# Patient Record
Sex: Male | Born: 1985
Health system: Southern US, Community
[De-identification: ages and names within clinical notes are randomized; demographics above are authoritative.]

## PROBLEM LIST (undated history)

## (undated) DIAGNOSIS — E349 Endocrine disorder, unspecified: Secondary | ICD-10-CM

## (undated) DIAGNOSIS — I4891 Unspecified atrial fibrillation: Secondary | ICD-10-CM

## (undated) DIAGNOSIS — I480 Paroxysmal atrial fibrillation: Secondary | ICD-10-CM

## (undated) DIAGNOSIS — R011 Cardiac murmur, unspecified: Secondary | ICD-10-CM

## (undated) DIAGNOSIS — I341 Nonrheumatic mitral (valve) prolapse: Secondary | ICD-10-CM

## (undated) HISTORY — DX: Paroxysmal atrial fibrillation: I48.0

## (undated) HISTORY — DX: Endocrine disorder, unspecified: E34.9

## (undated) HISTORY — DX: Nonrheumatic mitral (valve) prolapse: I34.1

## (undated) HISTORY — DX: Cardiac murmur, unspecified: R01.1

## (undated) HISTORY — PX: HIP SURGERY: SHX245

## (undated) HISTORY — DX: Unspecified atrial fibrillation: I48.91

---
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
## 2004-04-13 ENCOUNTER — Encounter: Admission: RE | Admit: 2004-04-13 | Discharge: 2004-04-13 | Payer: Self-pay | Admitting: Sports Medicine

## 2004-05-24 HISTORY — PX: WRIST SURGERY: SHX841

## 2004-12-11 HISTORY — PX: NM MYOCAR PERF WALL MOTION: HXRAD629

## 2005-01-21 ENCOUNTER — Inpatient Hospital Stay (HOSPITAL_COMMUNITY): Admission: EM | Admit: 2005-01-21 | Discharge: 2005-01-22 | Payer: Self-pay | Admitting: Emergency Medicine

## 2005-08-23 ENCOUNTER — Ambulatory Visit: Payer: Self-pay | Admitting: Internal Medicine

## 2005-08-26 ENCOUNTER — Ambulatory Visit (HOSPITAL_COMMUNITY): Admission: RE | Admit: 2005-08-26 | Discharge: 2005-08-26 | Payer: Self-pay | Admitting: Cardiovascular Disease

## 2010-09-20 ENCOUNTER — Inpatient Hospital Stay (HOSPITAL_COMMUNITY)
Admission: EM | Admit: 2010-09-20 | Discharge: 2010-09-21 | DRG: 310 | Disposition: A | Discharge status: Home / Self Care | Payer: 59 | Attending: Family Medicine | Admitting: Family Medicine

## 2010-09-20 DIAGNOSIS — I059 Rheumatic mitral valve disease, unspecified: Secondary | ICD-10-CM

## 2010-09-20 DIAGNOSIS — I4891 Unspecified atrial fibrillation: Secondary | ICD-10-CM

## 2010-09-20 DIAGNOSIS — F17200 Nicotine dependence, unspecified, uncomplicated: Secondary | ICD-10-CM | POA: Diagnosis present

## 2010-09-20 DIAGNOSIS — E291 Testicular hypofunction: Secondary | ICD-10-CM | POA: Diagnosis present

## 2010-09-20 DIAGNOSIS — L708 Other acne: Secondary | ICD-10-CM | POA: Diagnosis present

## 2010-09-20 LAB — CARDIAC PANEL(CRET KIN+CKTOT+MB+TROPI)
CK, MB: 1.1 ng/mL (ref 0.3–4.0)
CK, MB: 1 ng/mL (ref 0.3–4.0)
Relative Index: 0.6 (ref 0.0–2.5)
Relative Index: 0.6 (ref 0.0–2.5)
Total CK: 171 U/L (ref 7–232)
Total CK: 169 U/L (ref 7–232)
Troponin I: 0.01 ng/mL (ref 0.00–0.06)
Troponin I: 0.01 ng/mL (ref 0.00–0.06)

## 2010-09-20 LAB — MAGNESIUM: Magnesium: 2 mg/dL (ref 1.5–2.5)

## 2010-09-20 LAB — POCT I-STAT, CHEM 8
BUN: 19 mg/dL (ref 6–23)
Calcium, Ion: 1.02 mmol/L — ABNORMAL LOW (ref 1.12–1.32)
Chloride: 109 mEq/L (ref 96–112)
Creatinine, Ser: 1.4 mg/dL (ref 0.4–1.5)
Glucose, Bld: 105 mg/dL — ABNORMAL HIGH (ref 70–99)
HCT: 49 % (ref 39.0–52.0)
Hemoglobin: 16.7 g/dL (ref 13.0–17.0)
Potassium: 5.1 mEq/L (ref 3.5–5.1)
Sodium: 141 mEq/L (ref 135–145)
TCO2: 25 mmol/L (ref 0–100)

## 2010-09-20 LAB — DIFFERENTIAL
Basophils Absolute: 0.1 10*3/uL (ref 0.0–0.1)
Basophils Relative: 1 % (ref 0–1)
Eosinophils Absolute: 0 10*3/uL (ref 0.0–0.7)
Eosinophils Relative: 0 % (ref 0–5)
Lymphocytes Relative: 25 % (ref 12–46)
Lymphs Abs: 2.8 10*3/uL (ref 0.7–4.0)
Monocytes Absolute: 0.8 10*3/uL (ref 0.1–1.0)
Monocytes Relative: 7 % (ref 3–12)
Neutro Abs: 7.9 10*3/uL — ABNORMAL HIGH (ref 1.7–7.7)
Neutrophils Relative %: 68 % (ref 43–77)

## 2010-09-20 LAB — CBC
HCT: 45.8 % (ref 39.0–52.0)
Hemoglobin: 16.1 g/dL (ref 13.0–17.0)
MCH: 30.9 pg (ref 26.0–34.0)
MCHC: 35.2 g/dL (ref 30.0–36.0)
MCV: 87.9 fL (ref 78.0–100.0)
Platelets: 329 10*3/uL (ref 150–400)
RBC: 5.21 MIL/uL (ref 4.22–5.81)
RDW: 13.6 % (ref 11.5–15.5)
WBC: 11.6 10*3/uL — ABNORMAL HIGH (ref 4.0–10.5)

## 2010-09-20 LAB — PHOSPHORUS: Phosphorus: 3.4 mg/dL (ref 2.3–4.6)

## 2010-09-20 LAB — ETHANOL: Alcohol, Ethyl (B): <5 mg/dL (ref 0–10)

## 2010-09-20 LAB — TSH: TSH: 1.644 u[IU]/mL (ref 0.350–4.500)

## 2010-09-22 LAB — DRUGS OF ABUSE SCREEN W/O ALC, ROUTINE URINE
Amphetamine Screen, Ur: NEGATIVE
Barbiturate Quant, Ur: NEGATIVE
Benzodiazepines.: NEGATIVE
Cocaine Metabolites: NEGATIVE
Creatinine,U: 100.6 mg/dL
Marijuana Metabolite: NEGATIVE
Methadone: NEGATIVE
Opiate Screen, Urine: NEGATIVE
Phencyclidine (PCP): NEGATIVE
Propoxyphene: NEGATIVE

## 2010-10-06 HISTORY — PX: US ECHOCARDIOGRAPHY: HXRAD669

## 2010-10-07 NOTE — Discharge Summary (Signed)
NAMEMOHAN, ERVEN NO.:  [id number]  MEDICAL RECORD NO.:  [id number]           PATIENT TYPE:  E  LOCATION:  [facility]                         FACILITY:  [facility]  PHYSICIAN:  Nestor Ramp, MD        DATE OF BIRTH:  05-Apr-1986  DATE OF ADMISSION:  09/20/2010 DATE OF DISCHARGE:  09/21/2010                              DISCHARGE SUMMARY   PRIMARY CARE PHYSICIAN:  The patient was unassigned, he attends [facility] for acute visit.  PRIMARY CARDIOLOGIST:  The patient will follow up with cardiologist at [facility] and Vascular.  DISCHARGE DIAGNOSES: 1. Atrial fibrillation with rapid ventricular response, now rate     controlled. 2. Hypogonadism secondary to testicular torsion. 3. Acne.  DISCHARGE MEDICATIONS:  New medications at discharge: 1. Aspirin 81 mg 1 tablet p.o. daily. 2. Cardizem CD 120 mg 1 tablet p.o. at bedtime. 3. Testosterone gel 1 application topically daily. PERTINENT LABS AT DISCHARGE:  The patient's urine drug screen is negative.  Cardiac enzymes were negative x2.  TSH 1.644, phos 3.1, magnesium 2, alcohol level less than 5.  PERTINENT STUDIES AT DISCHARGE:  Repeat EKG obtained on September 21, 2010, with normal sinus rhythm with the heart rate of 68.  BRIEF HOSPITAL COURSE:  [name] is a 25 year old male who presented with the known history of AFib with RVR, first occurring in 2006, and again in 2009 who presented with acute onset of AFib with RVR on the morning of September 20, 2010, following a night out where he consumed excessive alcohol. 1. AFib with RVR.  The patient with rate control with Cardizem,     initially IV Cardizem, he required 20 mg bolus x2, and then 10 mg     per hour for rate control, who has transitioned overnight to p.o.     Cardizem 30 mg p.o. t.Marsh.d. and was rate controlled on that dose.     His lab studies were as mentioned above.  Cardiology consult was     obtained, and the patient was evaluated by  [facility] and     Vascular.  We have seen his rest AFib with RVR in 2006.  He was     stable and rate controlled on the Cardizem.  He should follow up     with repeat echo as an outpatient.  The patient's immediate release     Cardizem was changed to Cardizem CR 120     mg PO at bedtime.  The patient was also started on     aspirin as primary stroke prevention.  DISCHARGE PHYSICAL EXAM:  On day of discharge, [name] is in no acute distress.  He denies dizziness or feeling of syncope or presyncope when he stands.  He denies palpitations.  He is normal sinus rhythm at discharge with no murmurs, rubs, or gallops appreciated on cardiac exam.  DISCHARGE INSTRUCTIONS:  The patient was discharged to home with no limitations on activity or diet.  He was instructed to followup at [facility] and Vascular  or repeat echo scheduled on Oct 08, 2010.  He was instructed that office will call him  with the details.  DISCHARGE CONDITION:  The patient was discharged to home in stable medical condition.______________________________ Dessa Phi, MD   ______________________________ Nestor Ramp, MD    JF/MEDQ  D:  09/22/2010  T:  09/23/2010  Job:  [id number]  Electronically Signed by Dessa Phi MD on 10/02/2010 03:19:50 PM Electronically Signed by Denny Levy MD on 10/07/2010 04:33:11 PM

## 2010-10-22 NOTE — H&P (Signed)
[name], [name]           ACCOUNT NO.:  [id number]  MEDICAL RECORD NO.:  [id number]           PATIENT TYPE:  E  LOCATION:  [facility]                         FACILITY:  [facility]  PHYSICIAN:  Pearlean Brownie, M.D.DATE OF BIRTH:  11/23/1985  DATE OF ADMISSION:  09/20/2010 DATE OF DISCHARGE:                             HISTORY & PHYSICAL   PRIMARY CARE PHYSICIAN:  The patient attends [facility] with acute issues, last seen at February of this year.  CHIEF COMPLAINT:  Palpitations and dizziness.  HISTORY OF PRESENT ILLNESS:  The patient is a 25 year old male with a known history of paroxysmal AFib with RVR x2 episodes, presents with sudden onset of palpitations associated with feeling lightheaded/dizzy this morning that started around 7:15 a.m.  The patient was out celebrating at a bachelor party last evening where he consumes 14-15 beers in a 4-hour period.  He reports vomiting x1 and smoking cigars. He says that excess of alcohol intake is unusual for him.  He has a history of AFib, is unusual for him.  He went home, went to sleep, and woke up this morning with as mentioned above palpitations and feeling lightheaded.  He denies chest pain, shortness of breath, recurrent nausea, vomiting, syncope.  The patient first had episode of AFib with RVR in 2006 when he was hospitalized at [facility] overnight and treated with Cardizem.  He was evaluated by Cardiology and Electrophysiology.  Echo obtained around that admission, it did revealed mitral valve prolapse.  The patient also had an MRI of the heart that was within normal limits.  Negative for scarring or anomalous coronary arteries.  Prior to that admission, the patient wore Holter monitor for 3 months and was cleared from medical standpoint, return to football, and found to be medically stable with no repeat events.  Next, in 2009, the patient had a repeat of palpitations, went to [facility] ED, was  found to have AFib with RVR that spontaneously converted to normal sinus rhythm within 4 hours.  He was not admitted at that time. Since then, the patient has not seen a cardiologist.  He has not been on medications for rate control as an outpatient.  ED COURSE:  At this admission, the patient came in with a heart rate of 180.  He was given Cardizem 10 mg IV bolus x1 and then placed on 5 mg per hour.  His heart rate came down to 148.  He was re-bolused with 20 mg of IV Cardizem and his heart rate at that time was 134 and it came down to 124.  He is now maintained on Cardizem 10 mg per hour.  Heart rate when he got to the floor was 150, but it is now 105.  The patient is a full code.  ALLERGIES:  No known drug allergies.  MEDICATIONS:  He takes Testim for hypoandrogenism and doxycycline for acne.  PAST MEDICAL HISTORY:  AFib with RVR, first in 2006 and then in 2009. Mitral valve prolapse find as in echo in 2006.  Testicular torsion, 2006.  PAST SURGICAL HISTORY:  Testicular torsion repair in 2006, wrist surgery from accident sustained  during football in 2005.  SOCIAL HISTORY:  The patient lives alone with his dog.  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  He smokes cigars socially, drinks one to two beers per week.  No illicit drug use now or in the past.  FAMILY HISTORY:  Significant for prediabetes in his father.  His twin brothers are healthy.  REVIEW OF SYSTEMS:  Negative except for cardiovascular palpitation. Neuro, dizziness.  The rest of 12-system review of systems is negative.  PHYSICAL EXAM:  VITAL SIGNS:  Temperature 98.6, pulse 180 down to 108, respiratory rate 20, blood pressure initially 105/75 and then 125/96, most recent 105/73, O2 sats 97% on room air. GENERAL:  The patient is awake, alert, well-appearing adult male, in no acute distress. HEENT:  Normocephalic, atraumatic.  Moist mucous membranes. NECK:  Supple.  No JVD.  No carotid  bruits. CARDIOVASCULAR:  Irregularly irregular heart rate.  No murmurs, rubs or gallops appreciated. LUNGS:  Normal work of breathing.  Clear to auscultation bilaterally. No wheezes, rhonchi, or crackles.  Breath sounds are full and equal bilaterally. ABDOMEN:  Normoactive bowel sounds, soft, nontender, nondistended.  No masses. BACK:  Deferred. GU:  Deferred. RECTAL:  Deferred. EXTREMITIES:  No edema.  No calf tenderness. NEURO:  The patient is alert and oriented x3; 5/5 muscle strength in bilateral upper and lower extremities.  Sensation is full and equal bilaterally.  Normal gait.  LABS AND STUDIES:  EKG obtained in the ED, AFib with RVR, T-wave inversions in lateral leads V4-V6.  CBC:  White count 11.6, hemoglobin 16.1, hematocrit is 48.5, platelets 329.  I-STAT all within normal limits.  Sodium 141, potassium 5.1, chloride 109, bicarb 25, BUN 19, creatinine 1.4, glucose 105.  ASSESSMENT AND PLAN:  For this 25 year old male, who presents with palpitations and dizziness, found to have recurrent atrial fibrillation with rapid ventricular response. 1. Atrial fibrillation with rapid ventricular response.  The patient     has a known history of paroxysmal atrial fibrillation with rapid     ventricular response.  Had an extensive workup in 2006.  Likely     triggered at this time by excessive EtOH intake.  The patient has     stable cardiorespiratory status.  Plan to admit the patient to     tele, continue IV Cardizem and change this is to p.o. once the     patient is adequately rate controlled.  Goal heart rate less than     110.  Plan to start aspirin 81 mg p.o. daily.  The patient's CHADS     score is zero.  Cardiology consult with [facility] and     Vascular obtained.  We will follow up heart recommendations.     Appreciate Cardiology input.  We will obtain cardiac enzymes x2 to     rule out acute coronary syndrome, although, very unlikely given the     patient denies  chest pain or pressure, and also has no risk     factors.  We will also obtain mag and phos levels for completion. 2. History of mitral valve prolapse.  No murmurs appreciated on     auscultation, but may be obscured by elevated heart rate.  Plan to     obtain 2D echo to evaluate. 3. FEN/GI:  Lytes within normal limits.  Regular diet.  KVO IV fluids.     Follow up mag and phos. 4. Deep vein thrombosis prophylaxis, Lovenox 40 mg subcu daily.  5. Disposition.  Pending.  The patient's rate controlled on Cardizem     and transitioned to oral Cardizem and/or he converts his normal     sinus rhythm.  We will follow up Cardiology recommendations.     Anticipate short hospital course 1-2 days.    ______________________________ Dessa Phi, MD   ______________________________ Pearlean Brownie, M.D.    JF/MEDQ  D:  09/20/2010  T:  09/21/2010  Job:  [id number]  Electronically Signed by Dessa Phi MD on 10/02/2010 03:20:05 PM Electronically Signed by Pearlean Brownie M.D. on 10/22/2010 09:41:52 AM

## 2010-12-02 ENCOUNTER — Ambulatory Visit
Admission: RE | Admit: 2010-12-02 | Discharge: 2010-12-02 | Disposition: A | Discharge status: Home / Self Care | Payer: 59 | Source: Ambulatory Visit | Attending: Family Medicine | Admitting: Family Medicine

## 2010-12-02 ENCOUNTER — Other Ambulatory Visit: Payer: Self-pay | Admitting: Family Medicine

## 2010-12-02 DIAGNOSIS — S0990XA Unspecified injury of head, initial encounter: Secondary | ICD-10-CM

## 2011-10-03 ENCOUNTER — Ambulatory Visit (INDEPENDENT_AMBULATORY_CARE_PROVIDER_SITE_OTHER): Payer: 59 | Admitting: Family Medicine

## 2011-10-03 VITALS — BP 132/71 | HR 49 | Temp 98.7°F | Resp 16 | Ht 72.5 in | Wt 195.6 lb

## 2011-10-03 DIAGNOSIS — L723 Sebaceous cyst: Secondary | ICD-10-CM

## 2011-10-03 NOTE — Progress Notes (Signed)
Procedure:  Lidocaine 2% plain, injected around cyst.  4 cc Area outlined Sterile field and prep Elliptical excision made with # 15 scalpel. Medium sized sebaceous cyst excised, intact cyst wall. # 3 SQ/4.0 Vicryl #3 HM/# 1 SI 4.0 Ethilon Cleansed. Dressed. Patient tolerated procedure well.

## 2011-10-03 NOTE — Progress Notes (Signed)
26 yo man with chronic swollen subcutaneous nodule for years.  He is concerned about cancer.  O: 1 cm posterior cervical spine subQ nodule at C7, freely mobile, nontender.  A: sebaceous cyst  P:

## 2011-10-03 NOTE — Patient Instructions (Addendum)
Epidermal Cyst An epidermal cyst is sometimes called a sebaceous cyst, epidermal inclusion cyst, or infundibular cyst. These cysts usually contain a substance that looks "pasty" or "cheesy" and may have a bad smell. This substance is a protein called keratin. Epidermal cysts are usually found on the face, neck, or trunk. They may also occur in the vaginal area or other parts of the genitalia of both men and women. Epidermal cysts are usually small, painless, slow-growing bumps or lumps that move freely under the skin. It is important not to try to pop them. This may cause an infection and lead to tenderness and swelling. CAUSES  Epidermal cysts may be caused by a deep penetrating injury to the skin or a plugged hair follicle, often associated with acne. SYMPTOMS  Epidermal cysts can become inflamed and cause:  Redness.   Tenderness.   Increased temperature of the skin over the bumps or lumps.   Grayish-white, bad smelling material that drains from the bump or lump.  DIAGNOSIS  Epidermal cysts are easily diagnosed by your caregiver during an exam. Rarely, a tissue sample (biopsy) may be taken to rule out other conditions that may resemble epidermal cysts. TREATMENT   Epidermal cysts often get better and disappear on their own. They are rarely ever cancerous.   If a cyst becomes infected, it may become inflamed and tender. This may require opening and draining the cyst. Treatment with antibiotics may be necessary. When the infection is gone, the cyst may be removed with minor surgery.   Small, inflamed cysts can often be treated with antibiotics or by injecting steroid medicines.   Sometimes, epidermal cysts become large and bothersome. If this happens, surgical removal in your caregiver's office may be necessary.  HOME CARE INSTRUCTIONS  Only take over-the-counter or prescription medicines as directed by your caregiver.   Take your antibiotics as directed. Finish them even if you start to  feel better.  SEEK MEDICAL CARE IF:   Your cyst becomes tender, red, or swollen.   Your condition is not improving or is getting worse.   You have any other questions or concerns.  MAKE SURE YOU:  Understand these instructions.   Will watch your condition.   Will get help right away if you are not doing well or get worse.  Document Released: 04/10/2004 Document Revised: 04/29/2011 Document Reviewed: 11/16/2010 [facility] Patient Information 2012 [employer], [place].   WOUND CARE Please return in 10 days to have your stitches/staples removed or sooner if you have concerns. Marland Kitchen Keep area clean and dry for 24 hours. Do not remove bandage, if applied. . After 24 hours, remove bandage and wash wound gently with mild soap and warm water. Reapply a new bandage after cleaning wound, if directed. . Continue daily cleansing with soap and water until stitches/staples are removed. . Do not apply any ointments or creams to the wound while stitches/staples are in place, as this may cause delayed healing. . Notify the office if you experience any of the following signs of infection: Swelling, redness, pus drainage, streaking, fever >101.0 F . Notify the office if you experience excessive bleeding that does not stop after 15-20 minutes of constant, firm pressure.

## 2011-10-13 ENCOUNTER — Encounter: Payer: Self-pay | Admitting: Physician Assistant

## 2011-10-13 ENCOUNTER — Ambulatory Visit (INDEPENDENT_AMBULATORY_CARE_PROVIDER_SITE_OTHER): Payer: 59 | Admitting: Physician Assistant

## 2011-10-13 VITALS — BP 108/68 | HR 82 | Temp 98.0°F | Resp 16 | Ht 72.5 in | Wt 193.2 lb

## 2011-10-13 DIAGNOSIS — L723 Sebaceous cyst: Secondary | ICD-10-CM

## 2011-10-13 NOTE — Progress Notes (Signed)
[name] comes in today for suture removal s/p sebaceous cyst excision on 10/03/11.  He has done very well with minimal pain.    Posterior neck with # 4 sutures intact and removed.  Wound edges well approximated with small amount of central incision dehiscence.  Steri strips applied. No induration, redness, drainage or tenderness.   Recommend RTC prn

## 2011-10-22 ENCOUNTER — Ambulatory Visit (INDEPENDENT_AMBULATORY_CARE_PROVIDER_SITE_OTHER): Payer: 59 | Admitting: Family Medicine

## 2011-10-22 ENCOUNTER — Encounter: Payer: Self-pay | Admitting: Family Medicine

## 2011-10-22 VITALS — BP 119/74 | HR 64 | Temp 98.4°F | Resp 18 | Ht 72.5 in | Wt 197.0 lb

## 2011-10-22 DIAGNOSIS — E236 Other disorders of pituitary gland: Secondary | ICD-10-CM

## 2011-10-22 DIAGNOSIS — L709 Acne, unspecified: Secondary | ICD-10-CM

## 2011-10-22 DIAGNOSIS — L708 Other acne: Secondary | ICD-10-CM

## 2011-10-22 DIAGNOSIS — E291 Testicular hypofunction: Secondary | ICD-10-CM

## 2011-10-22 LAB — TESTOSTERONE: Testosterone: 224.98 ng/dL — ABNORMAL LOW (ref 300–890)

## 2011-10-22 MED ORDER — DOXYCYCLINE HYCLATE 100 MG PO CPEP: 100.0000 mg | ORAL_CAPSULE | Freq: Every day | ORAL | Status: DC

## 2011-10-22 MED ORDER — TESTOSTERONE 50 MG/5GM (1%) TD GEL: 5.0000 g | Freq: Every day | TRANSDERMAL | Status: DC

## 2011-10-22 NOTE — Patient Instructions (Addendum)
stick with gentle cleansers (like Cetaphil Liquid Cleanser), and can use over the counter benzoyl peroxide if mild acne.  If needed can also use doxycycline one per day as in past.  Your should receive a call or letter about your lab results within the next week to 10 days.  Recheck in 3 months.

## 2011-10-22 NOTE — Progress Notes (Signed)
  Subjective:    Patient ID: [name], male    DOB: 08-11-1985, 26 y.o.   MRN: [id number]  HPI [name] is a 26 y.o. male  Hypogonadism, low testosterone -  OV 12/22/10 - prior on 2 tubes testim qd, but when levels checked then - had been running out of meds - off for 2 weeks at that point.  testosterone 211, psa 0.79.  Plan to recheck levels in 3 months. Had been off meds as unable to return for OV.  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 No noticeable side effects when on meds.   Did [employer] workout.  Lost 20 pounds.    Acne - on doxy 100mg  QD prior - noticed breakouts with testosterone. Not taking now.  No current acne.   Sebaceous cyst excision - 10/03/11.   Review of Systems     Objective:   Physical Exam  Constitutional: He is oriented to person, place, and time. He appears well-developed and well-nourished.  HENT:  Head: Normocephalic and atraumatic.  Pulmonary/Chest: Effort normal.  Genitourinary: Testes normal. Right testis shows no mass. Left testis shows no mass.  Neurological: He is alert and oriented to person, place, and time.  Skin: Skin is warm and dry. No rash noted. No erythema.       No current comedones visualized.  Psychiatric: He has a normal mood and affect. His behavior is normal.       Assessment & Plan:  [name] is a 26 y.o. male   Acne -   stick with gentle cleansers (like Cetaphil Liquid Cleanser), and can use over the counter benzoyl peroxide if mild acne.  If needed can also use doxycycline one per day as in past.   Hypogonadism - restart testosterone -  1 packet each day, and recheck levels in 3 months.  Check PSA.

## 2011-10-23 LAB — PSA: PSA: 0.72 ng/mL (ref <=4.00)

## 2011-12-22 NOTE — Progress Notes (Signed)
Completed prior auth form for pt's Androgel and faxed into [facility] w/confirmation.

## 2011-12-23 ENCOUNTER — Telehealth: Payer: Self-pay

## 2011-12-23 NOTE — Telephone Encounter (Signed)
Spoke w/pt to ask whether he had any problems w/ the Testim he was previously on for low T because his insurance prefers this medication and will not cover Androgel unless he tried/failed the Testim. Pt reports that the Testim seemed to work fine for him and he is fine w/having [place] send this Rx into his pharmacy. Dr Neva Seat, can you send in the Rx for Testim that you want pt on? Pt already knows that we have discussed this and you will be doing so.

## 2011-12-26 ENCOUNTER — Ambulatory Visit (INDEPENDENT_AMBULATORY_CARE_PROVIDER_SITE_OTHER): Payer: 59 | Admitting: Family Medicine

## 2011-12-26 VITALS — BP 130/80 | HR 104 | Temp 99.2°F | Resp 16 | Ht 72.5 in | Wt 189.2 lb

## 2011-12-26 DIAGNOSIS — M79609 Pain in unspecified limb: Secondary | ICD-10-CM

## 2011-12-26 DIAGNOSIS — M79604 Pain in right leg: Secondary | ICD-10-CM

## 2011-12-26 DIAGNOSIS — M76899 Other specified enthesopathies of unspecified lower limb, excluding foot: Secondary | ICD-10-CM

## 2011-12-26 DIAGNOSIS — M7060 Trochanteric bursitis, unspecified hip: Secondary | ICD-10-CM

## 2011-12-26 MED ORDER — DICLOFENAC SODIUM 75 MG PO TBEC: 75.0000 mg | DELAYED_RELEASE_TABLET | Freq: Two times a day (BID) | ORAL | Status: DC

## 2011-12-26 MED ORDER — METHOCARBAMOL 750 MG PO TABS: ORAL_TABLET | ORAL | Status: DC

## 2011-12-26 NOTE — Patient Instructions (Addendum)
Ice  Medications as ordered  Return if worse or not better

## 2011-12-26 NOTE — Progress Notes (Signed)
Subjective: 26 year old male who 2 weeks ago was running the bases and got intense pain in both lateral thighs the left side seems to have subsided, but the right thigh continues to hurt him. He is playing ball again today and it hurt badly again. He has taken some OTC ibuprofen as well as using ice and heat on this. He works out regularly at [employer]. He is healthy otherwise. 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  Objective: tenderness at the right greater trochanteric knob.  Actually the intensity of the pain has subsided some now from earlier. Otherwise leg motion seems fine.  Assessment: Right leg muscle strain and inflammation of  greater trochanteric bursa  Plan: Anti-inflammatory medication Ice  If he continues to have further problems we will probably want to refer him for his physical therapy. He is going to do stretching.

## 2011-12-27 ENCOUNTER — Other Ambulatory Visit: Payer: Self-pay | Admitting: Family Medicine

## 2011-12-27 MED ORDER — TESTOSTERONE 50 MG/5GM (1%) TD GEL: 5.0000 g | Freq: Every day | TRANSDERMAL | Status: DC

## 2011-12-27 NOTE — Telephone Encounter (Signed)
Faxed Rx to [facility] and notified pt

## 2011-12-27 NOTE — Telephone Encounter (Signed)
i have prescribed it but it will print out - i can sign when i arrive today.

## 2011-12-29 ENCOUNTER — Ambulatory Visit (INDEPENDENT_AMBULATORY_CARE_PROVIDER_SITE_OTHER): Payer: 59 | Admitting: Family Medicine

## 2011-12-29 ENCOUNTER — Ambulatory Visit: Payer: 59

## 2011-12-29 VITALS — BP 110/62 | HR 56 | Temp 98.4°F | Resp 12 | Ht 72.5 in | Wt 197.8 lb

## 2011-12-29 DIAGNOSIS — M79659 Pain in unspecified thigh: Secondary | ICD-10-CM

## 2011-12-29 DIAGNOSIS — IMO0002 Reserved for concepts with insufficient information to code with codable children: Secondary | ICD-10-CM

## 2011-12-29 DIAGNOSIS — M79609 Pain in unspecified limb: Secondary | ICD-10-CM

## 2011-12-29 DIAGNOSIS — S76119A Strain of unspecified quadriceps muscle, fascia and tendon, initial encounter: Secondary | ICD-10-CM

## 2011-12-29 LAB — CK: Total CK: 174 U/L (ref 7–232)

## 2011-12-29 NOTE — Progress Notes (Signed)
  Subjective:    Patient ID: [name], male    DOB: June 20, 1985, 26 y.o.   MRN: [id number]  HPI [name] is a 26 y.o. male Seen 12/26/11 for upper leg pain, diagnosed with R muscle strain and trochanteric bursitis.  rx robaxin (taking at night), and voltaren 75mg  BID - taking twice per day - past 3 days.  Original injury - July 23rd - running down 1st base line - felt pains in front of both quads.    Able to get to 2nd base, but was limping.  Treated with ice and heat at that time.  L leg improved in 1st week, no problems in L leg now, but R leg has not improved. Still with pain in front of leg - feels better for few days - then if jogging up hill, or up and down steps, or getting out of car can flare it up.  Not as bad as initially, but not resolving.  No change since last visit.   No fever, no dark urine - now, or after initial sx's by memory. Slight weakness, more soreness.    Similar sx's when playing football in high school - treated with ace wraps on own.   Review of Systems As above.      Objective:   Physical Exam  Constitutional: He is oriented to person, place, and time. He appears well-developed and well-nourished.  HENT:  Head: Normocephalic and atraumatic.  Pulmonary/Chest: Effort normal.  Musculoskeletal: Normal range of motion. He exhibits no edema.       Right knee: Normal.       Legs:      Calf nt.   Lymphadenopathy:       Right: No inguinal adenopathy present.  Neurological: He is alert and oriented to person, place, and time.  Skin: Skin is warm and dry. No rash noted.  Psychiatric: He has a normal mood and affect. His behavior is normal.    [facility] reading (PRIMARY) by  Dr. Neva Seat: R femur: NAD.      Assessment & Plan:  [name] is a 26 y.o. male 1. Quadriceps strain  CK  2. Thigh pain  DG Femur Right, CK   Suspected strain with recurrent reinjury/flairs. No focal deficit.  Trial of pt, HEp with stretches prn, ace wrap.  Ok to use  robaxin and voltaren, but only as needed.  If not improving with PT - consider ortho eval or MRI.  rtc sooner if worse.

## 2012-01-20 ENCOUNTER — Ambulatory Visit (INDEPENDENT_AMBULATORY_CARE_PROVIDER_SITE_OTHER): Payer: 59 | Admitting: Family Medicine

## 2012-01-20 VITALS — BP 104/68 | HR 64 | Temp 98.0°F | Resp 16 | Ht 72.5 in | Wt 199.0 lb

## 2012-01-20 DIAGNOSIS — IMO0002 Reserved for concepts with insufficient information to code with codable children: Secondary | ICD-10-CM

## 2012-01-20 DIAGNOSIS — S76119A Strain of unspecified quadriceps muscle, fascia and tendon, initial encounter: Secondary | ICD-10-CM

## 2012-01-20 DIAGNOSIS — N529 Male erectile dysfunction, unspecified: Secondary | ICD-10-CM

## 2012-01-20 DIAGNOSIS — E291 Testicular hypofunction: Secondary | ICD-10-CM

## 2012-01-20 MED ORDER — TADALAFIL 20 MG PO TABS: 10.0000 mg | ORAL_TABLET | ORAL | Status: DC | PRN

## 2012-01-20 NOTE — Progress Notes (Signed)
  Subjective:    Patient ID: [name], male    DOB: Feb 17, 1986, 26 y.o.   MRN: [id number]  HPI [name] is a 26 y.o. male Follow up R thigh pain - much improved - no further pain.  Had 2 sessions of PT - HEP - stretches 2-3 times per day.  Able to play softball without difficulty.   ED/hypogonadism - see prior notes/phone messages. . Started testim about 3 weeks ago.  Plans on recheck levels.  Took Cialis in past - split 20mg  in half - only as needed.  Occasional ha - resolved with water.    Review of Systems As above.      Objective:   Physical Exam  Constitutional: He is oriented to person, place, and time.  Musculoskeletal:       Right upper leg: Normal. He exhibits no tenderness, no bony tenderness and no swelling.       Nontender, full strength of quad and hamstrings without difficulty.    Neurological: He is alert and oriented to person, place, and time.  Skin: Skin is warm and dry.  Psychiatric: He has a normal mood and affect. His behavior is normal.      Assessment & Plan:  [name] is a 26 y.o. male 1. ED (erectile dysfunction)  tadalafil (CIALIS) 20 MG tablet  2. Hypogonadism male  tadalafil (CIALIS) 20 MG tablet  3. Quadriceps strain     Quad strain - resolved.    Hypogonadism - now on testim - recheck levels in few months. rx cialis - 3 with 5 rf's.  Plan to recheck PSA, CBC

## 2012-01-20 NOTE — Patient Instructions (Signed)
Recheck testosterone levels in about 2 1/2 to 3 months.  Return to the clinic or go to the nearest emergency room if any of your symptoms worsen or new symptoms occur.

## 2012-06-27 ENCOUNTER — Ambulatory Visit (INDEPENDENT_AMBULATORY_CARE_PROVIDER_SITE_OTHER): Payer: BC Managed Care – PPO | Admitting: Family Medicine

## 2012-06-27 VITALS — BP 129/77 | HR 71 | Temp 98.0°F | Resp 18 | Ht 72.5 in | Wt 202.4 lb

## 2012-06-27 DIAGNOSIS — E291 Testicular hypofunction: Secondary | ICD-10-CM

## 2012-06-27 DIAGNOSIS — N529 Male erectile dysfunction, unspecified: Secondary | ICD-10-CM

## 2012-06-27 MED ORDER — TESTOSTERONE 50 MG/5GM (1%) TD GEL: 5.0000 g | Freq: Every day | TRANSDERMAL | Status: DC

## 2012-06-27 MED ORDER — TADALAFIL 20 MG PO TABS: 10.0000 mg | ORAL_TABLET | ORAL | Status: DC | PRN

## 2012-06-27 NOTE — Progress Notes (Signed)
  Subjective:    Patient ID: DNAIEL VOLLER, male    DOB: [birth year], 27 y.o.   MRN: [id number]  HPI OTHON GUARDIA is a 27 y.o. male  Hypogonadism - Here for recheck testosterone, labs. Started testim in August of last year - 3 weeks prior to last year. Feels like this dose is working well. Sex life is better.  Only taking cialis - 1/2 of 20mg  - once on the weekends.  No headaches unless not drinking enough. No depression or new psychiatric sx's.   Running out of medicine for past month - usually 1 packet per day, but over past few weeks - 3 days per week. Transferring to mail order now.   Corporate accounting.   Review of Systems  Constitutional: Negative for fatigue and unexpected weight change.  Eyes: Negative for visual disturbance.  Respiratory: Negative for cough, chest tightness and shortness of breath.   Cardiovascular: Negative for chest pain, palpitations and leg swelling.  Genitourinary: Negative for dysuria, urgency, frequency, hematuria, decreased urine volume, discharge and difficulty urinating.  Skin: Negative for rash.  Neurological: Negative for dizziness, light-headedness and headaches.  Psychiatric/Behavioral: Negative for dysphoric mood. The patient is not nervous/anxious.        Objective:   Physical Exam  Vitals reviewed. Constitutional: He is oriented to person, place, and time. He appears well-developed and well-nourished.  HENT:  Head: Normocephalic and atraumatic.  Eyes: EOM are normal. Pupils are equal, round, and reactive to light.  Neck: No JVD present. Carotid bruit is not present.  Cardiovascular: Normal rate, regular rhythm and normal heart sounds.   No murmur heard. Pulmonary/Chest: Effort normal and breath sounds normal. He has no rales.  Abdominal: Soft. Bowel sounds are normal. There is no tenderness.  Musculoskeletal: He exhibits no edema.  Neurological: He is alert and oriented to person, place, and time.  Skin: Skin is warm and  dry.  Psychiatric: He has a normal mood and affect.      Assessment & Plan:  GLENDEL JAGGERS is a 27 y.o. male 1. ED (erectile dysfunction)  tadalafil (CIALIS) 20 MG tablet, testosterone (TESTIM) 50 MG/5GM GEL, POCT CBC, Testosterone, PSA, Comprehensive metabolic panel, Lipid panel  2. Hypogonadism male  tadalafil (CIALIS) 20 MG tablet, testosterone (TESTIM) 50 MG/5GM GEL, Testosterone, POCT CBC, Testosterone, PSA, Comprehensive metabolic panel, Lipid panel   Due to intermittent (3day/week dosing) of testim, will check level today, but lab order for lab only visit in 6 weeks on meds everyday.   Continue cialis at current dose. Labs as above.   Patient Instructions  Lab visit only in 6 weeks.  Restart testim - once per day. Recheck in 6 months for a physical. Return to the clinic or go to the nearest emergency room if any of your symptoms worsen or new symptoms occur.

## 2012-06-27 NOTE — Patient Instructions (Signed)
Lab visit only in 6 weeks.  Restart testim - once per day. Recheck in 6 months for a physical. Return to the clinic or go to the nearest emergency room if any of your symptoms worsen or new symptoms occur.

## 2012-06-28 ENCOUNTER — Other Ambulatory Visit: Payer: Self-pay

## 2012-06-28 DIAGNOSIS — E291 Testicular hypofunction: Secondary | ICD-10-CM

## 2012-06-28 DIAGNOSIS — N529 Male erectile dysfunction, unspecified: Secondary | ICD-10-CM

## 2012-06-28 LAB — PSA: PSA: 0.78 ng/mL (ref <=4.00)

## 2012-06-28 LAB — COMPREHENSIVE METABOLIC PANEL
AST: 20 U/L (ref 0–37)
Albumin: 4.7 g/dL (ref 3.5–5.2)
BUN: 15 mg/dL (ref 6–23)
Calcium: 9.8 mg/dL (ref 8.4–10.5)
Chloride: 105 mEq/L (ref 96–112)
Glucose, Bld: 89 mg/dL (ref 70–99)
Potassium: 3.9 mEq/L (ref 3.5–5.3)
Total Protein: 7.3 g/dL (ref 6.0–8.3)

## 2012-06-28 LAB — CBC
HCT: 40.8 % (ref 39.0–52.0)
Hemoglobin: 14.6 g/dL (ref 13.0–17.0)
MCV: 85.7 fL (ref 78.0–100.0)
RDW: 13.9 % (ref 11.5–15.5)
WBC: 8.7 10*3/uL (ref 4.0–10.5)

## 2012-06-28 LAB — LIPID PANEL: HDL: 61 mg/dL (ref >39)

## 2012-06-28 LAB — TESTOSTERONE: Testosterone: 234 ng/dL — ABNORMAL LOW (ref 300–890)

## 2012-06-28 MED ORDER — TESTOSTERONE 50 MG/5GM (1%) TD GEL: 5.0000 g | Freq: Every day | TRANSDERMAL | Status: DC

## 2012-07-19 ENCOUNTER — Ambulatory Visit (INDEPENDENT_AMBULATORY_CARE_PROVIDER_SITE_OTHER): Payer: BC Managed Care – PPO | Admitting: Emergency Medicine

## 2012-07-19 VITALS — BP 118/86 | HR 87 | Temp 99.0°F | Resp 16 | Ht 73.0 in | Wt 204.4 lb

## 2012-07-19 DIAGNOSIS — L723 Sebaceous cyst: Secondary | ICD-10-CM

## 2012-07-19 DIAGNOSIS — M549 Dorsalgia, unspecified: Secondary | ICD-10-CM

## 2012-07-19 NOTE — Progress Notes (Signed)
[facility] [address] [place] [address] [phone]  Date:  07/19/2012   Name:  [name]   DOB:  08-28-1985   MRN:  [id number]  PCP:  Provider Not In System    Chief Complaint: lump on back and cyst on neck that was removed and now coming back again   History of Present Illness:  [name] is a 27 y.o. very pleasant male patient who presents with the following:  Has a sebaceous cyst on the mid back that is enlarging and causing pain when he exercises.    There is no problem list on file for this patient.   Past Medical History  Diagnosis Date  . Mitral valve prolapse     Past Surgical History  Procedure Laterality Date  . Wrist surgery      tendon repair  . Surgery scrotal / testicular      torsion    History  Substance Use Topics  . Smoking status: Former Games developer  . Smokeless tobacco: Never Used  . Alcohol Use: Yes     Comment: social    No family history on file.  No Known Allergies  Medication list has been reviewed and updated.  Current Outpatient Prescriptions on File Prior to Visit  Medication Sig Dispense Refill  . tadalafil (CIALIS) 20 MG tablet Take 0.5 tablets (10 mg total) by mouth every other day as needed.  9 tablet  1  . testosterone (TESTIM) 50 MG/5GM GEL Place 5 g onto the skin daily. Recheck levels in 6 weeks.  90 Tube  1  . aspirin 81 MG tablet Take 81 mg by mouth as needed.      . diclofenac (VOLTAREN) 75 MG EC tablet Take 1 tablet (75 mg total) by mouth 2 (two) times daily.  30 tablet  1  . doxycycline (DORYX) 100 MG DR capsule Take 1 capsule (100 mg total) by mouth daily. prn  90 capsule  1  . methocarbamol (ROBAXIN-750) 750 MG tablet Take 2 at bedtime for muscle relaxant  30 tablet  1   No current facility-administered medications on file prior to visit.    Review of Systems:  As per HPI, otherwise negative.    Physical Examination: Filed Vitals:   07/19/12 2001  BP: 118/86   Pulse: 87  Temp: 99 F (37.2 C)  Resp: 16   Filed Vitals:   07/19/12 2001  Height: 6\' 1"  (1.854 m)  Weight: 204 lb 6.4 oz (92.715 kg)   Body mass index is 26.97 kg/(m^2). Ideal Body Weight: Weight in (lb) to have BMI = 25: 189.1   GEN: WDWN, NAD, Non-toxic, Alert & Oriented x 3 HEENT: Atraumatic, Normocephalic.  Ears and Nose: No external deformity. EXTR: No clubbing/cyanosis/edema NEURO: Normal gait.  PSYCH: Normally interactive. Conversant. Not depressed or anxious appearing.  Calm demeanor.  BACK:  Sebaceous cyst back  Assessment and Plan: Sebaceous cyst Plan excision under local  Carmelina Dane, MD

## 2012-07-19 NOTE — Progress Notes (Signed)
Procedure Note: Verbal consent obtained from the patient.  Local anesthesia with 1cc 1% lidocaine with epinephrine.  Sterile prep and drape.  Incision with 15 blade.  Sebaceous cyst and cyst wall removed in total.  Incision closed with 5 simple interrupted sutures of 4-0 ethilon.  Dressing applied.  Wound care discussed.

## 2012-07-19 NOTE — Patient Instructions (Addendum)

## 2012-07-20 NOTE — Progress Notes (Signed)
Reviewed and agree.

## 2012-07-26 ENCOUNTER — Ambulatory Visit (INDEPENDENT_AMBULATORY_CARE_PROVIDER_SITE_OTHER): Payer: BC Managed Care – PPO | Admitting: Family Medicine

## 2012-07-26 VITALS — BP 112/84 | HR 92 | Temp 99.0°F | Resp 16 | Ht 73.75 in | Wt 202.2 lb

## 2012-07-26 DIAGNOSIS — F5102 Adjustment insomnia: Secondary | ICD-10-CM

## 2012-07-26 DIAGNOSIS — S21202D Unspecified open wound of left back wall of thorax without penetration into thoracic cavity, subsequent encounter: Secondary | ICD-10-CM

## 2012-07-26 DIAGNOSIS — E291 Testicular hypofunction: Secondary | ICD-10-CM

## 2012-07-26 DIAGNOSIS — Z5189 Encounter for other specified aftercare: Secondary | ICD-10-CM

## 2012-07-26 MED ORDER — ALPRAZOLAM 0.25 MG PO TABS: 0.2500 mg | ORAL_TABLET | Freq: Two times a day (BID) | ORAL | Status: DC | PRN

## 2012-07-26 NOTE — Patient Instructions (Addendum)
Return in the next few weeks to have the LAB ONLY visit. You can take 1 to 2 of the xanax on the plane if needed for sleep. The steristrip should fall off on its own in the next few days.

## 2012-07-26 NOTE — Progress Notes (Signed)
Subjective:    Patient ID: [name], male    DOB: 12-15-1985, 27 y.o.   MRN: [id number]  HPI [name] is a 27 y.o. male  Sebaceous cyst - back, removed 1 week ago - #5 si sutures placed. Here for sr. No drainage, discharge, or redness. No fevers.   Hypogonadism - OV 06/27/12. Intermittent dosing of testim at that time as running out. Plan for recheck 6 weeks into meds. No new side effects of meds, using testim QD.   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 Trouble sleeping on planes, would like to take something to help with this.   Results for orders placed in visit on 06/27/12  TESTOSTERONE      Result Value Range   Testosterone 234 (*) 300 - 890 ng/dL  PSA      Result Value Range   PSA 0.78  <=4.00 ng/mL  COMPREHENSIVE METABOLIC PANEL      Result Value Range   Sodium 140  135 - 145 mEq/L   Potassium 3.9  3.5 - 5.3 mEq/L   Chloride 105  96 - 112 mEq/L   CO2 25  19 - 32 mEq/L   Glucose, Bld 89  70 - 99 mg/dL   BUN 15  6 - 23 mg/dL   Creat 4.09  8.11 - 9.14 mg/dL   Total Bilirubin 0.5  0.3 - 1.2 mg/dL   Alkaline Phosphatase 47  39 - 117 U/L   AST 20  0 - 37 U/L   ALT 26  0 - 53 U/L   Total Protein 7.3  6.0 - 8.3 g/dL   Albumin 4.7  3.5 - 5.2 g/dL   Calcium 9.8  8.4 - 78.2 mg/dL  LIPID PANEL      Result Value Range   Cholesterol 178  0 - 200 mg/dL   Triglycerides 70  <956 mg/dL   HDL 61  >21 mg/dL   Total CHOL/HDL Ratio 2.9     VLDL 14  0 - 40 mg/dL   LDL Cholesterol 308 (*) 0 - 99 mg/dL  CBC      Result Value Range   WBC 8.7  4.0 - 10.5 K/uL   RBC 4.76  4.22 - 5.81 MIL/uL   Hemoglobin 14.6  13.0 - 17.0 g/dL   HCT 65.7  84.6 - 96.2 %   MCV 85.7  78.0 - 100.0 fL   MCH 30.7  26.0 - 34.0 pg   MCHC 35.8  30.0 - 36.0 g/dL   RDW 95.2  84.1 - 32.4 %   Platelets 311  150 - 400 K/uL    Review of Systems As above.      Objective:   Physical Exam  Vitals reviewed. Constitutional: He is oriented to person, place, and time. He appears  well-developed and well-nourished. No distress.  Pulmonary/Chest: Effort normal.  Neurological: He is alert and oriented to person, place, and time.  Skin: Skin is warm and dry.          Assessment & Plan:  [name] is a 27 y.o. male Insomnia, transient  - with airline travel. Plan: ALPRAZolam (XANAX) 0.25 MG tablet. 1-2 during flight if needed. Sed. Meds ordered this encounter  Medications  . ALPRAZolam (XANAX) 0.25 MG tablet    Sig: Take 1 tablet (0.25 mg total) by mouth 2 (two) times daily as needed for sleep.    Dispense:  6 tablet    Refill:  0  Hypogonadism, male.  Stable use of meds, due for lab visit only in next few weeks.   Wound of back, left, subsequent encounter.  SR as above. rtc precautions.   Patient Instructions  Return in the next few weeks to have the LAB ONLY visit. You can take 1 to 2 of the xanax on the plane if needed for sleep. The steristrip should fall off on its own in the next few days.

## 2012-10-23 ENCOUNTER — Ambulatory Visit (INDEPENDENT_AMBULATORY_CARE_PROVIDER_SITE_OTHER): Payer: BC Managed Care – PPO | Admitting: Physician Assistant

## 2012-10-23 ENCOUNTER — Encounter: Payer: Self-pay | Admitting: Physician Assistant

## 2012-10-23 VITALS — BP 120/81 | HR 51 | Temp 97.8°F | Resp 16 | Ht 73.0 in | Wt 201.0 lb

## 2012-10-23 DIAGNOSIS — W57XXXA Bitten or stung by nonvenomous insect and other nonvenomous arthropods, initial encounter: Secondary | ICD-10-CM

## 2012-10-23 DIAGNOSIS — T148 Other injury of unspecified body region: Secondary | ICD-10-CM

## 2012-10-23 NOTE — Patient Instructions (Addendum)
Keep an eye on the area of your tick bite, if it starts becoming more red/warm/swollen please let us know.  If you develop new symptoms like fever, body aches, headache, rash - let us know right away, this would be an indication to start antibiotics.   [place] Spotted Fever [place] Spotted Fever (RMSF) is the oldest known tick-borne disease of people in the [place]. This disease was named because it was first described among people in the [facility] area who had an illness characterized by a rash with red-purple-black spots. This disease is caused by a rickettsia (Rickettsia rickettsii), a bacteria carried by the tick. The [facility] wood tick and the American dog tick, acquire and transmit the RMSF bacteria (pictures NOT actual size). When a larval, nymphal or adult tick feeds on an infected rodent or larger animal, the tick can become infected. Infected adult ticks then feed on people who may then get RMSF. The tick transmits the disease to humans during a prolonged period of feeding that lasts many hours, days or even a couple weeks. The bite is painless and frequently goes unnoticed. An infected male tick may also pass the rickettsial bacteria to her eggs that then may mature to be infected adult ticks. The rickettsia that causes RMSF can also get into a person's body through damaged skin. A tick bite is not necessary. People can get RMSF if they crush a tick and get it's blood or body fluids on their skin through a small cut or sore.  DIAGNOSIS Diagnosis is made by laboratory tests.  TREATMENT Treatment is with antibiotics (medications that kill rickettsia and other bacteria). Immediate treatment usually prevents death. GEOGRAPHIC RANGE This disease was reported only in the [facility] until 1931. RMSF has more recently been described among individuals in all states except [place], [place] and [place]. The highest reported incidences of RMSF now occur among residents of  [place], [place], [place] and [address]. TIME OF YEAR  Most cases are diagnosed during late spring and summer when ticks are most active. However, especially in the warmer [place] states, a few cases occur during the winter. SYMPTOMS   Symptoms of RMSF begin from 2 to 14 days after a tick bite. The most common early symptoms are fever, muscle aches and headache followed by nausea (feeling sick to your stomach) or vomiting.  The RMSF rash is typically delayed until 3 or more days after symptom onset, and eventually develops in 9 of 10 infected patients by the 5th day of illness. If the disease is not treated it can cause death. If you get a fever, headache, muscle aches, rash, nausea or vomiting within 2 weeks of a possible tick bite or exposure you should see your caregiver immediately. PREVENTION Ticks prefer to hide in shady, moist ground litter. They can often be found above the ground clinging to tall grass, brush, shrubs and low tree branches. They also inhabit lawns and gardens, especially at the edges of woodlands and around old stone walls. Within the areas where ticks generally live, no naturally vegetated area can be considered completely free of infected ticks. The best precaution against RMSF is to avoid contact with soil, leaf litter and vegetation as much as possible in tick infested areas. For those who enjoy gardening or walking in their yards, clear brush and mow tall grass around houses and at the edges of gardens. This may help reduce the tick population in the immediate area. Applications of chemical insecticides by a  licensed professional in the spring (late May) and Fall (September) will also control ticks, especially in heavily infested areas. Treatment will never get rid of all the ticks. Getting rid of small animal populations that host ticks will also decrease the tick population. When working in the garden, [employer], or handling soil and vegetation, wear  light-colored protective clothing and gloves. Spot-check often to prevent ticks from reaching the skin. Ticks cannot jump or fly. They will not drop from an above-ground perch onto a passing animal. Once a tick gains access to human skin it climbs upward until it reaches a more protected area. For example, the back of the knee, groin, navel, armpit, ears or nape of the neck. It then begins the slow process of embedding itself in the skin. Campers, hikers, field workers, and others who spend time in wooded, brushy or tall grassy areas can avoid exposure to ticks by using the following precautions:  Wear light-colored clothing with a tight weave to spot ticks more easily and prevent contact with the skin.  Wear long pants tucked into socks, long-sleeved shirts tucked into pants and enclosed shoes or boots along with insect repellent.  Spray clothes with insect repellent containing either DEET or Permethrin. Only DEET can be used on exposed skin. Follow the manufacturer's directions carefully.  Wear a hat and keep long hair pulled back.  Stay on cleared, well-worn trails whenever possible.  Spot-check yourself and others often for the presence of ticks on clothes. If you find one, there are likely to be others. Check thoroughly.  Remove clothes after leaving tick-infested areas. If possible, wash them to eliminate any unseen ticks. Check yourself, your children and any pets from head to toe for the presence of ticks.  Shower and shampoo. You can greatly reduce your chances of contracting RMSF if you remove attached ticks as soon as possible. Regular checks of the body, including all body sites covered by hair (head, armpits, genitals), allow removal of the tick before rickettsial transmission. To remove an attached tick, use a forceps or tweezers to detach the intact tick without leaving mouth parts in the skin. The tick bite wound should be cleansed after tick removal. Remember the most common  symptoms of RMSF are fever, muscle aches, headache and nausea or vomiting with a later onset of rash. If you get these symptoms after a tick bite and while living in an area where RMSF is found, RMSF should be suspected. If the disease is not treated, it can cause death. See your caregiver immediately if you get these symptoms. Do this even if not aware of a tick bite. Document Released: 08/22/2000 Document Revised: 08/02/2011 Document Reviewed: 04/14/2009 [facility] Patient Information 2014 [place], [place].

## 2012-10-23 NOTE — Progress Notes (Signed)
  Subjective:    Patient ID: [name], male    DOB: 10-18-1985, 27 y.o.   MRN: [id number]  HPI   [name] is a very pleasant 26 yr old male here with concern for a tick bite.  Removed what he thinks was a tick from his right thigh yesterday.  States that what he removed was very small, not sure if it was the full tick.  Has been outside doing yard work, also did the [employer] in [place].  The area is red but not itchy or painful.  No fever, chills, HA, body aches, rash.  Unsure how long the tick was attached - possibly >72h.  Will be heading out of town for a wedding at the end of the week, wanted to be evaluated before that time.   Review of Systems  Constitutional: Negative for fever and chills.  HENT: Negative.   Respiratory: Negative.   Cardiovascular: Negative.   Gastrointestinal: Negative.   Musculoskeletal: Negative for myalgias and arthralgias.  Skin: Negative for rash.  Neurological: Negative for headaches.       Objective:   Physical Exam  Vitals reviewed. Constitutional: He is oriented to person, place, and time. He appears well-developed and well-nourished. No distress.  HENT:  Head: Normocephalic and atraumatic.  Eyes: Conjunctivae are normal. No scleral icterus.  Pulmonary/Chest: Effort normal.  Neurological: He is alert and oriented to person, place, and time.  Skin: Skin is warm and dry. No rash noted.     Small bruised area on right upper thigh, approx 0.5cm in diameter, no erythema/warmth/induration, non-tender  Psychiatric: He has a normal mood and affect. His behavior is normal.       Assessment & Plan:  Tick bite  [name] is a very pleasant 27 yr old male here after possible tick bite.  Pt states he removed what he thinks is a tick yesterday, unsure how long it was attached.  Currently pt is asymptomatic - no local symptoms; no fever, HA, rash, body aches.  Reassured pt that there is no indication for treatment at this time.   Discussed symptoms of RMSF, Lyme.  If pt develops any new symptoms including fever, HA, body aches, rash, he will call and we can start doxycyline BID x 7 days.  Pt understands and is in agreement with this plan.

## 2012-12-25 ENCOUNTER — Ambulatory Visit (INDEPENDENT_AMBULATORY_CARE_PROVIDER_SITE_OTHER): Payer: BC Managed Care – PPO | Admitting: Emergency Medicine

## 2012-12-25 VITALS — BP 122/82 | HR 74 | Temp 98.2°F | Resp 16 | Ht 73.0 in | Wt 201.2 lb

## 2012-12-25 DIAGNOSIS — L253 Unspecified contact dermatitis due to other chemical products: Secondary | ICD-10-CM

## 2012-12-25 DIAGNOSIS — R21 Rash and other nonspecific skin eruption: Secondary | ICD-10-CM

## 2012-12-25 MED ORDER — METHYLPREDNISOLONE ACETATE 80 MG/ML IJ SUSP
120.0000 mg | Freq: Once | INTRAMUSCULAR | Status: AC
  Administered 2012-12-25: 120 mg via INTRAMUSCULAR

## 2012-12-25 NOTE — Progress Notes (Signed)
[facility] [address], [place] [address] [phone]  Date:  12/25/2012   Name:  [name]   DOB:  22-Oct-1985   MRN:  [id number]  PCP:  Provider Not In System    Chief Complaint: Rash   History of Present Illness:  [name] is a 27 y.o. very pleasant male patient who presents with the following:  Sprayed a deck cleaner on his deck Thursday and by Saturday had a pretty well established rash on his face, legs and testicles and right arm.  No respiratory or visual or occular symptoms.  Intensely pruritic.  No improvement with over the counter medications or other home remedies. Denies other complaint or health concern today.   There are no active problems to display for this patient.   Past Medical History  Diagnosis Date  . Mitral valve prolapse     Past Surgical History  Procedure Laterality Date  . Wrist surgery      tendon repair  . Surgery scrotal / testicular      torsion    History  Substance Use Topics  . Smoking status: Former Games developer  . Smokeless tobacco: Never Used  . Alcohol Use: Yes     Comment: social    Family History  Problem Relation Age of Onset  . Cancer Mother   . Diabetes Father   . Hypertension Father   . Cancer Maternal Grandmother   . Cancer Paternal Grandmother   . Cancer Paternal Grandfather     No Known Allergies  Medication list has been reviewed and updated.  Current Outpatient Prescriptions on File Prior to Visit  Medication Sig Dispense Refill  . tadalafil (CIALIS) 20 MG tablet Take 0.5 tablets (10 mg total) by mouth every other day as needed.  9 tablet  1  . testosterone (TESTIM) 50 MG/5GM GEL Place 5 g onto the skin daily. Recheck levels in 6 weeks.  90 Tube  1   No current facility-administered medications on file prior to visit.    Review of Systems:  As per HPI, otherwise negative.    Physical Examination: Filed Vitals:   12/25/12 1130  BP: 122/82  Pulse: 74   Temp: 98.2 F (36.8 C)  Resp: 16   Filed Vitals:   12/25/12 1130  Height: 6\' 1"  (1.854 m)  Weight: 201 lb 3.2 oz (91.264 kg)   Body mass index is 26.55 kg/(m^2). Ideal Body Weight: Weight in (lb) to have BMI = 25: 189.1   GEN: WDWN, NAD, Non-toxic, Alert & Oriented x 3 HEENT: Atraumatic, Normocephalic.  Ears and Nose: No external deformity. EXTR: No clubbing/cyanosis/edema NEURO: Normal gait.  PSYCH: Normally interactive. Conversant. Not depressed or anxious appearing.  Calm demeanor.  CHEST CTA BS= SKIN:  Erythematous rash on face, right arm and legs.  Maculopapular pruritic rash.  Assessment and Plan: Contact dermatitis Depo medrol Benadryl   Signed,  Phillips Odor, MD

## 2012-12-25 NOTE — Patient Instructions (Addendum)

## 2012-12-26 ENCOUNTER — Encounter: Payer: Self-pay | Admitting: *Deleted

## 2012-12-27 ENCOUNTER — Encounter: Payer: Self-pay | Admitting: Cardiovascular Disease

## 2012-12-27 ENCOUNTER — Ambulatory Visit (INDEPENDENT_AMBULATORY_CARE_PROVIDER_SITE_OTHER): Payer: BC Managed Care – PPO | Admitting: Cardiovascular Disease

## 2012-12-27 VITALS — BP 124/88 | HR 59 | Ht 73.5 in | Wt 210.6 lb

## 2012-12-27 DIAGNOSIS — I4891 Unspecified atrial fibrillation: Secondary | ICD-10-CM

## 2012-12-27 DIAGNOSIS — I480 Paroxysmal atrial fibrillation: Secondary | ICD-10-CM

## 2012-12-27 NOTE — Progress Notes (Signed)
Patient ID: [name], male   DOB: 06/29/1985, 27 y.o.   MRN: [id number]      Reason for office visit Paroxysmal atrial fibrillation  Nijel has had a good year since his last appointment. He has very rare palpitations. None of them last more than a couple of seconds. He has not had sustained arrhythmia. He is avoiding excessive alcohol. He is physically fit and runs 3-5 times a week running 3 to 6  10 minute miles each time. Has no complaints. She has not had focal neurological events. He is relocating to [place] [facility]) in 2 weeks.    No Known Allergies  Current Outpatient Prescriptions  Medication Sig Dispense Refill  . aspirin 81 MG tablet Take 81 mg by mouth daily.      . tadalafil (CIALIS) 20 MG tablet Take 0.5 tablets (10 mg total) by mouth every other day as needed.  9 tablet  1  . testosterone (TESTIM) 50 MG/5GM GEL Place 5 g onto the skin daily. Recheck levels in 6 weeks.  90 Tube  1   No current facility-administered medications for this visit.    Past Medical History  Diagnosis Date  . Mitral valve prolapse   . Paroxysmal atrial fibrillation     Past Surgical History  Procedure Laterality Date  . Wrist surgery  05/2004    tendon repair  . Surgery scrotal / testicular  06/1994    torsion  . US echocardiography  10/06/2010    LA mildly dilated,trace MR  . Nm myocar perf wall motion  12/11/2004    [place], MD: no ischemia    Family History  Problem Relation Age of Onset  . Cancer Mother   . Diabetes Father   . Hypertension Father   . Cancer Maternal Grandmother   . Cancer Paternal Grandmother   . Cancer Paternal Grandfather     History   Social History  . Marital Status: Single    Spouse Name: N/A    Number of Children: N/A  . Years of Education: N/A   Occupational History  . Not on file.   Social History Main Topics  . Smoking status: Former Smoker    Types: Cigars  . Smokeless tobacco: Never Used  . Alcohol Use: Yes   Comment: social  . Drug Use: No  . Sexually Active: Yes    Birth Control/ Protection: None   Other Topics Concern  . Not on file   Social History Narrative  . No narrative on file    Review of systems: The patient specifically denies any chest pain at rest or with exertion, dyspnea at rest or with exertion, orthopnea, paroxysmal nocturnal dyspnea, syncope, palpitations, focal neurological deficits, intermittent claudication, lower extremity edema, unexplained weight gain, cough, hemoptysis or wheezing.  The patient also denies abdominal pain, nausea, vomiting, dysphagia, diarrhea, constipation, polyuria, polydipsia, dysuria, hematuria, frequency, urgency, abnormal bleeding or bruising, fever, chills, unexpected weight changes, mood swings, change in skin or hair texture, change in voice quality, auditory or visual problems, allergic reactions or rashes, new musculoskeletal complaints other than usual "aches and pains".   PHYSICAL EXAM BP 124/88  Pulse 59  Ht 6' 1.5" (1.867 m)  Wt 210 lb 9.6 oz (95.528 kg)  BMI 27.41 kg/m2  General: Alert, oriented x3, no distress Head: no evidence of trauma, PERRL, EOMI, no exophtalmos or lid lag, no myxedema, no xanthelasma; normal ears, nose and oropharynx Neck: normal jugular venous pulsations and no hepatojugular reflux; brisk carotid pulses  without delay and no carotid bruits Chest: clear to auscultation, no signs of consolidation by percussion or palpation, normal fremitus, symmetrical and full respiratory excursions Cardiovascular: normal position and quality of the apical impulse, regular rhythm, normal first and second heart sounds, no murmurs, rubs or gallops Abdomen: no tenderness or distention, no masses by palpation, no abnormal pulsatility or arterial bruits, normal bowel sounds, no hepatosplenomegaly Extremities: no clubbing, cyanosis or edema; 2+ radial, ulnar and brachial pulses bilaterally; 2+ right femoral, posterior tibial and  dorsalis pedis pulses; 2+ left femoral, posterior tibial and dorsalis pedis pulses; no subclavian or femoral bruits Neurological: grossly nonfocal   EKG: Sinus rhythm, normal tracing  Lipid Panel     Component Value Date/Time   CHOL 178 06/27/2012 1904   TRIG 70 06/27/2012 1904   HDL 61 06/27/2012 1904   CHOLHDL 2.9 06/27/2012 1904   VLDL 14 06/27/2012 1904   LDLCALC 103* 06/27/2012 1904    BMET    Component Value Date/Time   NA 140 06/27/2012 1904   K 3.9 06/27/2012 1904   CL 105 06/27/2012 1904   CO2 25 06/27/2012 1904   GLUCOSE 89 06/27/2012 1904   BUN 15 06/27/2012 1904   CREATININE 0.99 06/27/2012 1904   CREATININE 1.4 09/20/2010 0858   CALCIUM 9.8 06/27/2012 1904     ASSESSMENT AND PLAN Paroxysmal atrial fibrillation He has had 3 separate episodes of symptomatic paroxysmal atrial fibrillation, but none recently. Only one of them was associated with a clear precipitant cause (excessive alcohol). They have all resolved spontaneously. He does have a mildly dilated left atrium measuring 42 mm in maximum diameter. There is no other evidence for structural heart abnormalities, thyroid illness, obstructive sleep apnea or other obvious underlying substrate for arrhythmia. He is on aspirin for embolism prophylaxis since otherwise he has no evidence of wrist fractures for stroke. I recommended that he find a cardiologist in [place] once he moves there. We will be happy to provide all his records as well as appropriate advice during the transition period.  Orders Placed This Encounter  Procedures  . EKG 12-Lead   No orders of the defined types were placed in this encounter.    Junious Silk, MD, [facility] [phone] office [phone] pager

## 2012-12-27 NOTE — Patient Instructions (Addendum)
Your physician recommends that you schedule a follow-up appointment in: as needed  

## 2012-12-27 NOTE — Assessment & Plan Note (Signed)
He has had 3 separate episodes of symptomatic paroxysmal atrial fibrillation, but none recently. Only one of them was associated with a clear precipitant cause (excessive alcohol). They have all resolved spontaneously. He does have a mildly dilated left atrium measuring 42 mm in maximum diameter. There is no other evidence for structural heart abnormalities, thyroid illness, obstructive sleep apnea or other obvious underlying substrate for arrhythmia. He is on aspirin for embolism prophylaxis since otherwise he has no evidence of wrist fractures for stroke. I recommended that he find a cardiologist in [place] once he moves there. We will be happy to provide all his records as well as appropriate advice during the transition period.

## 2014-06-08 ENCOUNTER — Ambulatory Visit (INDEPENDENT_AMBULATORY_CARE_PROVIDER_SITE_OTHER): Payer: BLUE CROSS/BLUE SHIELD | Admitting: Family Medicine

## 2014-06-08 VITALS — BP 122/72 | HR 92 | Temp 98.9°F | Resp 17 | Ht 73.0 in | Wt 214.0 lb

## 2014-06-08 DIAGNOSIS — R630 Anorexia: Secondary | ICD-10-CM

## 2014-06-08 DIAGNOSIS — I480 Paroxysmal atrial fibrillation: Secondary | ICD-10-CM

## 2014-06-08 DIAGNOSIS — E291 Testicular hypofunction: Secondary | ICD-10-CM

## 2014-06-08 DIAGNOSIS — G245 Blepharospasm: Secondary | ICD-10-CM

## 2014-06-08 DIAGNOSIS — Z5181 Encounter for therapeutic drug level monitoring: Secondary | ICD-10-CM

## 2014-06-08 DIAGNOSIS — R7989 Other specified abnormal findings of blood chemistry: Secondary | ICD-10-CM

## 2014-06-08 DIAGNOSIS — N529 Male erectile dysfunction, unspecified: Secondary | ICD-10-CM

## 2014-06-08 LAB — LIPID PANEL
Cholesterol: 171 mg/dL (ref 0–200)
HDL: 45 mg/dL (ref >39)
LDL CALC: 112 mg/dL — AB (ref 0–99)
TRIGLYCERIDES: 72 mg/dL (ref <150)
Total CHOL/HDL Ratio: 3.8 Ratio
VLDL: 14 mg/dL (ref 0–40)

## 2014-06-08 LAB — TSH: TSH: 2.633 u[IU]/mL (ref 0.350–4.500)

## 2014-06-08 LAB — TESTOSTERONE: Testosterone: 185 ng/dL — ABNORMAL LOW (ref 300–890)

## 2014-06-08 MED ORDER — TADALAFIL 20 MG PO TABS: 10.0000 mg | ORAL_TABLET | ORAL | Status: DC | PRN

## 2014-06-08 NOTE — Patient Instructions (Addendum)
Your sleep difficulty may be from traveling - this can also affect appetite. As you are now staying here - try to eat three meals a day for now.  You should receive a call or letter about your lab results within the next week to 10 days.  If appetite and sleep not improving in next few weeks - return for further discussion and testing. Tylenol PM if needed. Other info below.  Once we receive results of labs, I should be able to send in testosterone prescription again.  Restart aspirin 81mg  each day for your paroxysmal afib.   Insomnia Insomnia is frequent trouble falling and/or staying asleep. Insomnia can be a long term problem or a short term problem. Both are common. Insomnia can be a short term problem when the wakefulness is related to a certain stress or worry. Long term insomnia is often related to ongoing stress during waking hours and/or poor sleeping habits. Overtime, sleep deprivation itself can make the problem worse. Every little thing feels more severe because you are overtired and your ability to cope is decreased. CAUSES   Stress, anxiety, and depression.  Poor sleeping habits.  Distractions such as TV in the bedroom.  Naps close to bedtime.  Engaging in emotionally charged conversations before bed.  Technical reading before sleep.  Alcohol and other sedatives. They may make the problem worse. They can hurt normal sleep patterns and normal dream activity.  Stimulants such as caffeine for several hours prior to bedtime.  Pain syndromes and shortness of breath can cause insomnia.  Exercise late at night.  Changing time zones may cause sleeping problems (jet lag). It is sometimes helpful to have someone observe your sleeping patterns. They should look for periods of not breathing during the night (sleep apnea). They should also look to see how long those periods last. If you live alone or observers are uncertain, you can also be observed at a sleep clinic where your sleep  patterns will be professionally monitored. Sleep apnea requires a checkup and treatment. Give your caregivers your medical history. Give your caregivers observations your family has made about your sleep.  SYMPTOMS   Not feeling rested in the morning.  Anxiety and restlessness at bedtime.  Difficulty falling and staying asleep. TREATMENT   Your caregiver may prescribe treatment for an underlying medical disorders. Your caregiver can give advice or help if you are using alcohol or other drugs for self-medication. Treatment of underlying problems will usually eliminate insomnia problems.  Medications can be prescribed for short time use. They are generally not recommended for lengthy use.  Over-the-counter sleep medicines are not recommended for lengthy use. They can be habit forming.  You can promote easier sleeping by making lifestyle changes such as:  Using relaxation techniques that help with breathing and reduce muscle tension.  Exercising earlier in the day.  Changing your diet and the time of your last meal. No night time snacks.  Establish a regular time to go to bed.  Counseling can help with stressful problems and worry.  Soothing music and white noise may be helpful if there are background noises you cannot remove.  Stop tedious detailed work at least one hour before bedtime. HOME CARE INSTRUCTIONS   Keep a diary. Inform your caregiver about your progress. This includes any medication side effects. See your caregiver regularly. Take note of:  Times when you are asleep.  Times when you are awake during the night.  The quality of your sleep.  How you  feel the next day. This information will help your caregiver care for you.  Get out of bed if you are still awake after 15 minutes. Read or do some quiet activity. Keep the lights down. Wait until you feel sleepy and go back to bed.  Keep regular sleeping and waking hours. Avoid naps.  Exercise regularly.  Avoid  distractions at bedtime. Distractions include watching television or engaging in any intense or detailed activity like attempting to balance the household checkbook.  Develop a bedtime ritual. Keep a familiar routine of bathing, brushing your teeth, climbing into bed at the same time each night, listening to soothing music. Routines increase the success of falling to sleep faster.  Use relaxation techniques. This can be using breathing and muscle tension release routines. It can also include visualizing peaceful scenes. You can also help control troubling or intruding thoughts by keeping your mind occupied with boring or repetitive thoughts like the old concept of counting sheep. You can make it more creative like imagining planting one beautiful flower after another in your backyard garden.  During your day, work to eliminate stress. When this is not possible use some of the previous suggestions to help reduce the anxiety that accompanies stressful situations. MAKE SURE YOU:   Understand these instructions.  Will watch your condition.  Will get help right away if you are not doing well or get worse. Document Released: 05/07/2000 Document Revised: 08/02/2011 Document Reviewed: 06/07/2007 [facility] Patient Information 2015 [place], [place]. This information is not intended to replace advice given to you by your health care provider. Make sure you discuss any questions you have with your health care provider.

## 2014-06-08 NOTE — Progress Notes (Signed)
Subjective:    Patient ID: [name], male    DOB: [birth year], 29 y.o.   MRN: [id number] This chart was scribed for Xavier Ray, MD by Zola Button, Medical Scribe. This patient was seen in Room 8 and the patient's care was started at 9:15 AM.   HPI HPI Comments: [name] is a 29 y.o. male with a hx of paroxysmal A Fib who presents to the [facility] complaining of not eating well for 2 weeks.  Atrial Fibrillation: Patient has hx of paroxysmal A Fib followed by [name] in the past, last seen 2014. On ASA for embolism prophylaxis. He reports having some intermittent palpitations, but not changed from baseline. Patient has stopped taking his ASA and other prescribed medications recently.  Low Testosterone: Patient also has hx of low testosterone with erectile dysfunction. He was last seen for this by me in 2014; his most recent testosterone level was 234 on 06/27/12. Last PSA normal in 2014.  He had been intermittently taking his Testim tubes. Patient denies testicular masses.  Decreased Appetite: Patient has had decreased appetite for the past 2 weeks. Before the loss of appetite, he notes that he had persistent, intermittent hiccups for a few days. He also notes having some GERD symptoms during that time, which he attributes to eating jalapeno poppers; however, he has not had GERD symptoms the past 2 weeks. 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 When he does have appetite to eat, he will typically only eat a muffin in the morning and a [employer] sub during the day. His last bowel movement was 2 days ago in the afternoon. He denies belching, burping, coughing, nausea, abdominal pain, constipation, vomiting and fever. Patient has not been exercising lately. He states he has not been drinking much; he had 3 beers the past week.  Left Eye  Twitch: Patient also reports waking up in the mornings with a twitch in his left eye that became more noticeable the past 2 weeks. He has had some trouble sleeping, more difficulty with staying asleep than falling asleep. He reports having problems with his left eye since childhood.  Patient Active Problem List   Diagnosis Date Noted  . Paroxysmal atrial fibrillation 12/27/2012   Past Medical History  Diagnosis Date  . Mitral valve prolapse   . Paroxysmal atrial fibrillation    Past Surgical History  Procedure Laterality Date  . Wrist surgery  05/2004    tendon repair  . Surgery scrotal / testicular  06/1994    torsion  . US echocardiography  10/06/2010    LA mildly dilated,trace MR  . Nm myocar perf wall motion  12/11/2004    [place], MD: no ischemia   No Known Allergies Prior to Admission medications   Medication Sig Start Date End Date Taking? Authorizing Provider  aspirin 81 MG tablet Take 81 mg by mouth daily.    Historical Provider, MD  tadalafil (CIALIS) 20 MG tablet Take 0.5 tablets (10 mg total) by mouth every other day as needed. Patient not taking: Reported on 06/08/2014 06/27/12   Wendie Agreste, MD  testosterone (TESTIM) 50 MG/5GM GEL Place 5 g onto the skin daily. Recheck levels in 6 weeks. Patient not taking: Reported on 06/08/2014 06/28/12   Wendie Agreste, MD   History   Social History  . Marital Status:  Single    Spouse Name: N/A    Number of Children: N/A  . Years of Education: N/A   Occupational History  . Not on file.   Social History Main Topics  . Smoking status: Former Smoker    Types: Cigars  . Smokeless tobacco: Never Used  . Alcohol Use: Yes     Comment: social  . Drug Use: No  . Sexual Activity: Yes    Birth Control/ Protection: None   Other Topics Concern  . Not on file   Social History Narrative     Review of Systems  Constitutional: Positive for appetite change. Negative for fever, fatigue and unexpected weight change.  Eyes:  Negative for visual disturbance.  Respiratory: Negative for cough, chest tightness and shortness of breath.   Cardiovascular: Positive for palpitations. Negative for chest pain and leg swelling.  Gastrointestinal: Negative for nausea, vomiting, abdominal pain, constipation and blood in stool.  Genitourinary: Negative for scrotal swelling.  Neurological: Negative for dizziness, light-headedness and headaches.  Psychiatric/Behavioral: Positive for sleep disturbance.       Objective:   Physical Exam  Constitutional: He is oriented to person, place, and time. He appears well-developed and well-nourished.  HENT:  Head: Normocephalic and atraumatic.  Eyes: Pupils are equal, round, and reactive to light.  EOM testing decreased lateral gaze with left eye (chronic since childhood)  Neck: No JVD present. Carotid bruit is not present.  Cardiovascular: Normal rate, regular rhythm and normal heart sounds.   No murmur heard. Pulmonary/Chest: Effort normal and breath sounds normal. He has no rales.  Musculoskeletal: He exhibits no edema.  Neurological: He is alert and oriented to person, place, and time.  Skin: Skin is warm and dry.  Psychiatric: He has a normal mood and affect.  Vitals reviewed.     Filed Vitals:   06/08/14 0833  BP: 122/72  Pulse: 92  Temp: 98.9 F (37.2 C)  TempSrc: Oral  Resp: 17  Height: 6\' 1"  (1.854 m)  Weight: 214 lb (97.07 kg)  SpO2: 98%       Assessment & Plan:   [name] is a 29 y.o. male Low testosterone - Plan: PSA Hypogonadism male - Plan: tadalafil (CIALIS) 20 MG tablet, Lipid panel, Testosterone,  Erectile dysfunction, unspecified erectile dysfunction type Encounter for medication monitoring - Plan: Lipid panel, Testosterone, CBC  - check testosterone (low in past and on medication), other labs above for monitoring, anticipate refill of testim.   -Cialis Rx if needed. Risks discussed.    Loss of appetite - Plan: TSH  - may be due to  travel and work stressors. Recommended meals tid, and if appetite not improving in next few weeks now that he is staying in town - RTC for further eval.  Eye twitch - Plan: TSH, but may be related to fatigue. If persists with normalization of routine next few weeks - look into other causes.   Paroxysmal atrial fibrillation  - rare symptoms, restart ASA 81mg  QD.   Meds ordered this encounter  Medications  . tadalafil (CIALIS) 20 MG tablet    Sig: Take 0.5 tablets (10 mg total) by mouth every other day as needed.    Dispense:  9 tablet    Refill:  1   Patient Instructions  Your sleep difficulty may be from traveling - this can also affect appetite. As you are now staying here - try to eat three meals a day for now.  You should receive a call or  letter about your lab results within the next week to 10 days.  If appetite and sleep not improving in next few weeks - return for further discussion and testing. Tylenol PM if needed. Other info below.  Once we receive results of labs, I should be able to send in testosterone prescription again.  Restart aspirin 81mg  each day for your paroxysmal afib.   Insomnia Insomnia is frequent trouble falling and/or staying asleep. Insomnia can be a long term problem or a short term problem. Both are common. Insomnia can be a short term problem when the wakefulness is related to a certain stress or worry. Long term insomnia is often related to ongoing stress during waking hours and/or poor sleeping habits. Overtime, sleep deprivation itself can make the problem worse. Every little thing feels more severe because you are overtired and your ability to cope is decreased. CAUSES   Stress, anxiety, and depression.  Poor sleeping habits.  Distractions such as TV in the bedroom.  Naps close to bedtime.  Engaging in emotionally charged conversations before bed.  Technical reading before sleep.  Alcohol and other sedatives. They may make the problem worse. They  can hurt normal sleep patterns and normal dream activity.  Stimulants such as caffeine for several hours prior to bedtime.  Pain syndromes and shortness of breath can cause insomnia.  Exercise late at night.  Changing time zones may cause sleeping problems (jet lag). It is sometimes helpful to have someone observe your sleeping patterns. They should look for periods of not breathing during the night (sleep apnea). They should also look to see how long those periods last. If you live alone or observers are uncertain, you can also be observed at a sleep clinic where your sleep patterns will be professionally monitored. Sleep apnea requires a checkup and treatment. Give your caregivers your medical history. Give your caregivers observations your family has made about your sleep.  SYMPTOMS   Not feeling rested in the morning.  Anxiety and restlessness at bedtime.  Difficulty falling and staying asleep. TREATMENT   Your caregiver may prescribe treatment for an underlying medical disorders. Your caregiver can give advice or help if you are using alcohol or other drugs for self-medication. Treatment of underlying problems will usually eliminate insomnia problems.  Medications can be prescribed for short time use. They are generally not recommended for lengthy use.  Over-the-counter sleep medicines are not recommended for lengthy use. They can be habit forming.  You can promote easier sleeping by making lifestyle changes such as:  Using relaxation techniques that help with breathing and reduce muscle tension.  Exercising earlier in the day.  Changing your diet and the time of your last meal. No night time snacks.  Establish a regular time to go to bed.  Counseling can help with stressful problems and worry.  Soothing music and white noise may be helpful if there are background noises you cannot remove.  Stop tedious detailed work at least one hour before bedtime. HOME CARE  INSTRUCTIONS   Keep a diary. Inform your caregiver about your progress. This includes any medication side effects. See your caregiver regularly. Take note of:  Times when you are asleep.  Times when you are awake during the night.  The quality of your sleep.  How you feel the next day. This information will help your caregiver care for you.  Get out of bed if you are still awake after 15 minutes. Read or do some quiet activity. Keep the lights  down. Wait until you feel sleepy and go back to bed.  Keep regular sleeping and waking hours. Avoid naps.  Exercise regularly.  Avoid distractions at bedtime. Distractions include watching television or engaging in any intense or detailed activity like attempting to balance the household checkbook.  Develop a bedtime ritual. Keep a familiar routine of bathing, brushing your teeth, climbing into bed at the same time each night, listening to soothing music. Routines increase the success of falling to sleep faster.  Use relaxation techniques. This can be using breathing and muscle tension release routines. It can also include visualizing peaceful scenes. You can also help control troubling or intruding thoughts by keeping your mind occupied with boring or repetitive thoughts like the old concept of counting sheep. You can make it more creative like imagining planting one beautiful flower after another in your backyard garden.  During your day, work to eliminate stress. When this is not possible use some of the previous suggestions to help reduce the anxiety that accompanies stressful situations. MAKE SURE YOU:   Understand these instructions.  Will watch your condition.  Will get help right away if you are not doing well or get worse. Document Released: 05/07/2000 Document Revised: 08/02/2011 Document Reviewed: 06/07/2007 [facility] Patient Information 2015 [place], [place]. This information is not intended to replace advice given to you by your  health care provider. Make sure you discuss any questions you have with your health care provider.        I personally performed the services described in this documentation, which was scribed in my presence. The recorded information has been reviewed and considered, and addended by me as needed.

## 2014-06-09 LAB — CBC
HCT: 43.7 % (ref 39.0–52.0)
HEMOGLOBIN: 14.9 g/dL (ref 13.0–17.0)
MCH: 30.2 pg (ref 26.0–34.0)
MCHC: 34.1 g/dL (ref 30.0–36.0)
MCV: 88.6 fL (ref 78.0–100.0)
MPV: 9.2 fL (ref 8.6–12.4)
Platelets: 353 10*3/uL (ref 150–400)
RBC: 4.93 MIL/uL (ref 4.22–5.81)
RDW: 13.2 % (ref 11.5–15.5)
WBC: 7.5 10*3/uL (ref 4.0–10.5)

## 2014-06-10 LAB — PSA: PSA: 0.57 ng/mL (ref <=4.00)

## 2014-06-10 NOTE — Progress Notes (Signed)
Scheduled an appointment for CPE on 09/30/14 at 230 with Dr. Carlota Raspberry.

## 2014-06-11 ENCOUNTER — Telehealth: Payer: Self-pay | Admitting: Radiology

## 2014-06-11 NOTE — Telephone Encounter (Signed)
Pt wanted to know results of labs. I advised him that once Dr.Greene had an opportunity to review his labs we would contact him with the results.  Please advise. Thanks!

## 2014-06-13 ENCOUNTER — Telehealth: Payer: Self-pay | Admitting: Radiology

## 2014-06-13 ENCOUNTER — Other Ambulatory Visit: Payer: Self-pay | Admitting: Family Medicine

## 2014-06-13 DIAGNOSIS — E291 Testicular hypofunction: Secondary | ICD-10-CM

## 2014-06-13 DIAGNOSIS — N529 Male erectile dysfunction, unspecified: Secondary | ICD-10-CM

## 2014-06-13 MED ORDER — TESTOSTERONE 50 MG/5GM (1%) TD GEL: 5.0000 g | Freq: Every day | TRANSDERMAL | Status: DC

## 2014-06-13 NOTE — Telephone Encounter (Signed)
Pt calling about lab results  

## 2014-07-05 ENCOUNTER — Telehealth: Payer: Self-pay

## 2014-07-05 DIAGNOSIS — E291 Testicular hypofunction: Secondary | ICD-10-CM

## 2014-07-05 NOTE — Telephone Encounter (Signed)
Patient is calling to follow up on medications testosterone and Cialis. Patient was informed that the Cialis was received by the pharmacy and that the testosterone was printed but not seen in the pick up drawer. Please print medication for testosterone and call patient when ready for pick up!

## 2014-07-09 MED ORDER — TESTOSTERONE 50 MG/5GM (1%) TD GEL: 5.0000 g | Freq: Every day | TRANSDERMAL | Status: DC

## 2014-07-09 NOTE — Telephone Encounter (Signed)
Called pharm and they reported that Testim reqs a PA, Sampson [id number], ID# [id number]. Checked PA form and it advises that Testim is a non-preferred topical, and their preferred is Androgel. It looks like pt used to use Androgel and we changed it to Testim bc at the time his ins preferred that one. Dr Carlota Raspberry, do you want to change back to Androgel?

## 2014-07-09 NOTE — Telephone Encounter (Signed)
Ok to change to Androgel.  rx provided - ready for pickup.

## 2014-07-10 NOTE — Telephone Encounter (Signed)
Faxed Rx and notified pt of change on VM. Asked for CB if any ?s/concerns.

## 2014-08-16 ENCOUNTER — Other Ambulatory Visit: Payer: Self-pay

## 2014-08-16 DIAGNOSIS — E291 Testicular hypofunction: Secondary | ICD-10-CM

## 2014-08-16 NOTE — Telephone Encounter (Signed)
Pt would like a refill on testosterone (ANDROGEL) 50 MG/5GM (1%) GEL [[id number]]. Please advise at [phone]

## 2014-08-19 MED ORDER — TESTOSTERONE 50 MG/5GM (1%) TD GEL: 5.0000 g | Freq: Every day | TRANSDERMAL | Status: DC

## 2014-08-19 NOTE — Telephone Encounter (Signed)
Will refill androgel.  Return for morning lab only visit in next 2-3 weeks to check level on this dose.

## 2014-08-20 ENCOUNTER — Telehealth: Payer: Self-pay

## 2014-08-20 NOTE — Telephone Encounter (Signed)
Rx faxed. Called pt to let him know and left a detailed message on his machine. Advised to come in for fasting labs in 2-3 weeks.

## 2014-08-20 NOTE — Telephone Encounter (Signed)
error 

## 2014-08-26 ENCOUNTER — Telehealth: Payer: Self-pay

## 2014-08-26 NOTE — Telephone Encounter (Signed)
PA for Testosterone started.

## 2014-08-28 ENCOUNTER — Telehealth: Payer: Self-pay

## 2014-08-28 NOTE — Telephone Encounter (Signed)
Faxed results already.

## 2014-08-28 NOTE — Telephone Encounter (Signed)
Received fax from [facility]. Faxed results of Testosterone levels.

## 2014-08-28 NOTE — Telephone Encounter (Signed)
This is concerning Mr. Pacella medication. She needs the baseline testosterone level with a reference range and to verify if this is for a testosterone gel or androgel. Please fax to [phone]. Attention to [id number]. The phone # is [phone]

## 2014-09-06 NOTE — Telephone Encounter (Signed)
Androgel approved until 05/23/2038. Letter faxed to pharm.

## 2014-09-20 ENCOUNTER — Telehealth: Payer: Self-pay

## 2014-09-20 NOTE — Telephone Encounter (Signed)
PA for Cialis 20 mg started.

## 2014-09-24 NOTE — Telephone Encounter (Signed)
PA denied, pt has to try Viagra.

## 2014-09-30 ENCOUNTER — Ambulatory Visit (INDEPENDENT_AMBULATORY_CARE_PROVIDER_SITE_OTHER): Payer: BLUE CROSS/BLUE SHIELD | Admitting: Family Medicine

## 2014-09-30 ENCOUNTER — Encounter: Payer: Self-pay | Admitting: Family Medicine

## 2014-09-30 VITALS — BP 126/80 | HR 54 | Temp 98.6°F | Resp 16 | Ht 73.0 in | Wt 211.8 lb

## 2014-09-30 DIAGNOSIS — E291 Testicular hypofunction: Secondary | ICD-10-CM | POA: Diagnosis not present

## 2014-09-30 DIAGNOSIS — Z131 Encounter for screening for diabetes mellitus: Secondary | ICD-10-CM

## 2014-09-30 DIAGNOSIS — N529 Male erectile dysfunction, unspecified: Secondary | ICD-10-CM

## 2014-09-30 DIAGNOSIS — Z7251 High risk heterosexual behavior: Secondary | ICD-10-CM | POA: Diagnosis not present

## 2014-09-30 DIAGNOSIS — Z79899 Other long term (current) drug therapy: Secondary | ICD-10-CM | POA: Diagnosis not present

## 2014-09-30 DIAGNOSIS — Z0000 Encounter for general adult medical examination without abnormal findings: Secondary | ICD-10-CM

## 2014-09-30 DIAGNOSIS — Z23 Encounter for immunization: Secondary | ICD-10-CM | POA: Diagnosis not present

## 2014-09-30 DIAGNOSIS — Z202 Contact with and (suspected) exposure to infections with a predominantly sexual mode of transmission: Secondary | ICD-10-CM | POA: Diagnosis not present

## 2014-09-30 LAB — COMPLETE METABOLIC PANEL WITH GFR
ALBUMIN: 4.6 g/dL (ref 3.5–5.2)
ALK PHOS: 50 U/L (ref 39–117)
ALT: 36 U/L (ref 0–53)
AST: 25 U/L (ref 0–37)
BUN: 14 mg/dL (ref 6–23)
CHLORIDE: 102 meq/L (ref 96–112)
CO2: 24 mEq/L (ref 19–32)
CREATININE: 1.03 mg/dL (ref 0.50–1.35)
Calcium: 9.7 mg/dL (ref 8.4–10.5)
GFR, Est African American: >89 mL/min
Glucose, Bld: 86 mg/dL (ref 70–99)
POTASSIUM: 4 meq/L (ref 3.5–5.3)
Sodium: 135 mEq/L (ref 135–145)
Total Bilirubin: 1 mg/dL (ref 0.2–1.2)
Total Protein: 7.8 g/dL (ref 6.0–8.3)

## 2014-09-30 LAB — CBC
HCT: 42.9 % (ref 39.0–52.0)
HEMOGLOBIN: 14.9 g/dL (ref 13.0–17.0)
MCH: 30.2 pg (ref 26.0–34.0)
MCHC: 34.7 g/dL (ref 30.0–36.0)
MCV: 87 fL (ref 78.0–100.0)
MPV: 9 fL (ref 8.6–12.4)
Platelets: 313 10*3/uL (ref 150–400)
RBC: 4.93 MIL/uL (ref 4.22–5.81)
RDW: 13.7 % (ref 11.5–15.5)
WBC: 9.1 10*3/uL (ref 4.0–10.5)

## 2014-09-30 LAB — LIPID PANEL
Cholesterol: 185 mg/dL (ref 0–200)
HDL: 61 mg/dL (ref >=40)
LDL Cholesterol: 104 mg/dL — ABNORMAL HIGH (ref 0–99)
Total CHOL/HDL Ratio: 3 Ratio
Triglycerides: 101 mg/dL (ref <150)
VLDL: 20 mg/dL (ref 0–40)

## 2014-09-30 MED ORDER — SILDENAFIL CITRATE 100 MG PO TABS: 50.0000 mg | ORAL_TABLET | Freq: Every day | ORAL | Status: DC | PRN

## 2014-09-30 MED ORDER — TESTOSTERONE 50 MG/5GM (1%) TD GEL: 5.0000 g | Freq: Every day | TRANSDERMAL | Status: DC

## 2014-09-30 NOTE — Patient Instructions (Signed)
You should receive a call or letter about your lab results within the next week to 10 days.  Morning lab visit only in 4-6 weeks to check testosterone, then follow up office visit in 3-4 months.  Return to the clinic or go to the nearest emergency room if any of your symptoms worsen or new symptoms occur.   Keeping you healthy  Get these tests  Blood pressure- Have your blood pressure checked once a year by your healthcare provider.  Normal blood pressure is 120/80.  Weight- Have your body mass index (BMI) calculated to screen for obesity.  BMI is a measure of body fat based on height and weight. You can also calculate your own BMI at [employer]velBags.it.  Cholesterol- Have your cholesterol checked regularly starting at age 48, sooner may be necessary if you have diabetes, high blood pressure, if a family member developed heart diseases at an early age or if you smoke.   Chlamydia, HIV, and other sexual transmitted disease- Get screened each year until the age of 31 then within three months of each new sexual partner.  Diabetes- Have your blood sugar checked regularly if you have high blood pressure, high cholesterol, a family history of diabetes or if you are overweight.  Get these vaccines  Flu shot- Every fall.  Tetanus shot- Every 10 years.  Menactra- Single dose; prevents meningitis.  Take these steps  Don't smoke- If you do smoke, ask your healthcare provider about quitting. For tips on how to quit, go to www.smokefree.gov or call 1-800-QUIT-NOW.  Be physically active- Exercise 5 days a week for at least 30 minutes.  If you are not already physically active start slow and gradually work up to 30 minutes of moderate physical activity.  Examples of moderate activity include walking briskly, mowing the yard, dancing, swimming bicycling, etc.  Eat a healthy diet- Eat a variety of healthy foods such as fruits, vegetables, low fat milk, low fat cheese, yogurt, lean meats,  poultry, fish, beans, tofu, etc.  For more information on healthy eating, go to www.thenutritionsource.org  Drink alcohol in moderation- Limit alcohol intake two drinks or less a day.  Never drink and drive.  Dentist- Brush and floss teeth twice daily; visit your dentis twice a year.  Depression-Your emotional health is as important as your physical health.  If you're feeling down, losing interest in things you normally enjoy please talk with your healthcare provider.  Gun Safety- If you keep a gun in your home, keep it unloaded and with the safety lock on.  Bullets should be stored separately.  Helmet use- Always wear a helmet when riding a motorcycle, bicycle, rollerblading or skateboarding.  Safe sex- If you may be exposed to a sexually transmitted infection, use a condom  Seat belts- Seat bels can save your life; always wear one.  Smoke/Carbon Monoxide detectors- These detectors need to be installed on the appropriate level of your home.  Replace batteries at least once a year.  Skin Cancer- When out in the sun, cover up and use sunscreen SPF 15 or higher.  Violence- If anyone is threatening or hurting you, please tell your healthcare provider.

## 2014-09-30 NOTE — Progress Notes (Addendum)
Subjective:  This chart was scribed for Xavier Ray, MD by [facility], medical scribe at [facility].The patient was seen in exam room 29 and the patient's care was started at 2:52 PM.    Patient ID: [name], male    DOB: 1985/11/19, 29 y.o.   MRN: [id number] Chief Complaint  Patient presents with  . Annual Exam   HPI HPI Comments: [name] is a 29 y.o. male who presents to [facility] for an annual physical exam. Last seen on 06/08/2014.  Pt is on new insurance since May 1 st.  Family History of Cancer: Family history of prostate cancer, grandfather passed away to prostate cancer. His mother has breast cancer. Father does not have prostate cancer. No skin cancer.  Atrial Fibrillation:  Patient has history of paroxysmal atrial fibrillation followed by [name] a cardiologist in the past, last seen 2014. He is on aspirin 81 mg a day.   Low testosterone:  Patient also has history of low testosterone with erectile dysfunction. Tried taking Cialis in the past but this is not covered by his insurance, apparently Viagra will be covered per last phone call. He has never tried Viagra. We restarted testim 5 gram on January 21 st however to non-preferred agent, switched to Androgel this was last filled March 28 th. There was a plan for repeat testosterone in 6 weeks after starting medication but this has yet to be drawn. Normal CBC.  Lab Results  Component Value Date   TESTOSTERONE 185* 06/08/2014    Lab Results  Component Value Date   PSA 0.57 06/08/2014   PSA 0.78 06/27/2012   PSA 0.72 10/22/2011   Lab Results  Component Value Date   CHOL 171 06/08/2014   HDL 45 06/08/2014   LDLCALC 112* 06/08/2014   TRIG 72 06/08/2014   CHOLHDL 3.8 06/08/2014   Immunizations:  No immunizations on file. Tdap was updated today.  Depression screening:  Depression screen [facility] 2/9 09/30/2014  Decreased Interest 0  Down, Depressed,  Hopeless 0  PHQ - 2 Score 0   Exercise: Bikes and runs 3-5 times a week when he is not busy, down to 1 time a week no since he is busy at work.  Dentist: He has not seen a dentist in over a year.   Sexually transmitted disease screening: He is currently single, not sexually active currently and no recent sexual partners in the past year. He has had exposure so sexually transmitted diseases.  Patient Active Problem List   Diagnosis Date Noted  . Paroxysmal atrial fibrillation 12/27/2012   Past Medical History  Diagnosis Date  . Mitral valve prolapse   . Paroxysmal atrial fibrillation   . Heart murmur   . A-fib    Past Surgical History  Procedure Laterality Date  . Wrist surgery  05/2004    tendon repair  . Surgery scrotal / testicular  06/1994    torsion  . US echocardiography  10/06/2010    LA mildly dilated,trace MR  . Nm myocar perf wall motion  12/11/2004    [place], MD: no ischemia   No Known Allergies Prior to Admission medications   Medication Sig Start Date End Date Taking? Authorizing Provider  aspirin 81 MG tablet Take 81 mg by mouth daily.    Historical Provider, MD  tadalafil (CIALIS) 20 MG tablet Take 0.5 tablets (10 mg total) by mouth every other day as needed. 06/08/14  Wendie Agreste, MD  testosterone (ANDROGEL) 50 MG/5GM (1%) GEL Place 5 g onto the skin daily. 08/19/14   Wendie Agreste, MD   History   Social History  . Marital Status: Single    Spouse Name: N/A  . Number of Children: N/A  . Years of Education: N/A   Occupational History  . Not on file.   Social History Main Topics  . Smoking status: Former Smoker    Types: Cigars  . Smokeless tobacco: Never Used  . Alcohol Use: Yes     Comment: social  . Drug Use: No  . Sexual Activity: Yes    Birth Control/ Protection: None   Other Topics Concern  . Not on file   Social History Narrative   Review of Systems 13 point ROS reviewed in patient survey, negative other than listed above or in  reviewed nursing note.     Objective:  BP 126/80 mmHg  Pulse 54  Temp(Src) 98.6 F (37 C) (Oral)  Resp 16  Ht 6\' 1"  (1.854 m)  Wt 211 lb 12.8 oz (96.072 kg)  BMI 27.95 kg/m2  SpO2 100%   Visual Acuity Screening   Right eye Left eye Both eyes  Without correction: 20/20 20/20 20/20   With correction:     Physical Exam  Constitutional: He is oriented to person, place, and time. He appears well-developed and well-nourished. No distress.  HENT:  Head: Normocephalic and atraumatic.  Eyes: Pupils are equal, round, and reactive to light.  Neck: Normal range of motion.  Cardiovascular: Normal rate and regular rhythm.   Pulmonary/Chest: Effort normal. No respiratory distress.  Musculoskeletal: Normal range of motion.  Neurological: He is alert and oriented to person, place, and time.  Skin: Skin is warm and dry.  Psychiatric: He has a normal mood and affect. His behavior is normal.  Nursing note and vitals reviewed.     Assessment & Plan:   [name] is a 29 y.o. male Annual physical exam  --anticipatory guidance as below in AVS, screening labs above. Health maintenance items as above in HPI discussed/recommended as applicable.   Need for Tdap vaccination - Plan: Tdap vaccine greater than or equal to 7yo IM given  Exposure to STD (remote), History of unprotected sex - Plan: HIV antibody, RPR, GC/Chlamydia Probe Amp  -sti testing above. Safer sex practices.   Hypogonadism male - Plan: Testosterone, sildenafil (VIAGRA) 100 MG tablet, DISCONTINUED: sildenafil (VIAGRA) 100 MG tablet  -check CMP, lipids, CBC, PSA.    -testosterone level in 4-6 weeks, as just started testosterone few weeks ago.   -reordered androgel, but may need to substitute as new insurance.   Erectile dysfunction, unspecified erectile dysfunction type  -viagra Rx, SED and risks of med discussed. May need substitution based on insurance coverage.   Screening for diabetes mellitus - Plan: COMPLETE  METABOLIC PANEL WITH GFR  High risk medication use - Plan: CBC, Lipid panel, PSA, COMPLETE METABOLIC PANEL WITH GFR   Meds ordered this encounter  Medications  . DISCONTD: sildenafil (VIAGRA) 100 MG tablet    Sig: Take 0.5-1 tablets (50-100 mg total) by mouth daily as needed for erectile dysfunction.    Dispense:  15 tablet    Refill:  1  . testosterone (ANDROGEL) 50 MG/5GM (1%) GEL    Sig: Place 5 g onto the skin daily.    Dispense:  90 Tube    Refill:  0  . sildenafil (VIAGRA) 100 MG tablet    Sig:  Take 0.5-1 tablets (50-100 mg total) by mouth daily as needed for erectile dysfunction.    Dispense:  15 tablet    Refill:  1   Patient Instructions  You should receive a call or letter about your lab results within the next week to 10 days.  Morning lab visit only in 4-6 weeks to check testosterone, then follow up office visit in 3-4 months.  Return to the clinic or go to the nearest emergency room if any of your symptoms worsen or new symptoms occur.   Keeping you healthy  Get these tests  Blood pressure- Have your blood pressure checked once a year by your healthcare provider.  Normal blood pressure is 120/80.  Weight- Have your body mass index (BMI) calculated to screen for obesity.  BMI is a measure of body fat based on height and weight. You can also calculate your own BMI at [employer].  Cholesterol- Have your cholesterol checked regularly starting at age 55, sooner may be necessary if you have diabetes, high blood pressure, if a family member developed heart diseases at an early age or if you smoke.   Chlamydia, HIV, and other sexual transmitted disease- Get screened each year until the age of 40 then within three months of each new sexual partner.  Diabetes- Have your blood sugar checked regularly if you have high blood pressure, high cholesterol, a family history of diabetes or if you are overweight.  Get these vaccines  Flu shot- Every fall.  Tetanus  shot- Every 10 years.  Menactra- Single dose; prevents meningitis.  Take these steps  Don't smoke- If you do smoke, ask your healthcare provider about quitting. For tips on how to quit, go to www.smokefree.gov or call 1-800-QUIT-NOW.  Be physically active- Exercise 5 days a week for at least 30 minutes.  If you are not already physically active start slow and gradually work up to 30 minutes of moderate physical activity.  Examples of moderate activity include walking briskly, mowing the yard, dancing, swimming bicycling, etc.  Eat a healthy diet- Eat a variety of healthy foods such as fruits, vegetables, low fat milk, low fat cheese, yogurt, lean meats, poultry, fish, beans, tofu, etc.  For more information on healthy eating, go to www.thenutritionsource.org  Drink alcohol in moderation- Limit alcohol intake two drinks or less a day.  Never drink and drive.  Dentist- Brush and floss teeth twice daily; visit your dentis twice a year.  Depression-Your emotional health is as important as your physical health.  If you're feeling down, losing interest in things you normally enjoy please talk with your healthcare provider.  Gun Safety- If you keep a gun in your home, keep it unloaded and with the safety lock on.  Bullets should be stored separately.  Helmet use- Always wear a helmet when riding a motorcycle, bicycle, rollerblading or skateboarding.  Safe sex- If you may be exposed to a sexually transmitted infection, use a condom  Seat belts- Seat bels can save your life; always wear one.  Smoke/Carbon Monoxide detectors- These detectors need to be installed on the appropriate level of your home.  Replace batteries at least once a year.  Skin Cancer- When out in the sun, cover up and use sunscreen SPF 15 or higher.  Violence- If anyone is threatening or hurting you, please tell your healthcare provider.     I personally performed the services described in this documentation, which was  scribed in my presence. The recorded information has been reviewed and  considered, and addended by me as needed.

## 2014-09-30 NOTE — Progress Notes (Signed)
   Subjective:    Patient ID: [name], male    DOB: 1985/10/08, 29 y.o.   MRN: [id number]  HPI    Review of Systems  Constitutional: Negative.   HENT: Negative.   Eyes: Negative.   Respiratory: Negative.   Cardiovascular: Negative.   Gastrointestinal: Negative.   Endocrine: Negative.   Genitourinary: Negative.   Musculoskeletal: Negative.   Skin: Negative.   Allergic/Immunologic: Negative.   Neurological: Negative.   Hematological: Negative.   Psychiatric/Behavioral: Negative.        Objective:   Physical Exam        Assessment & Plan:

## 2014-10-01 LAB — HIV ANTIBODY (ROUTINE TESTING W REFLEX): HIV: NONREACTIVE

## 2014-10-01 LAB — RPR

## 2014-10-01 LAB — PSA: PSA: 0.74 ng/mL (ref <=4.00)

## 2014-10-02 LAB — GC/CHLAMYDIA PROBE AMP
CT PROBE, AMP APTIMA: NEGATIVE
GC PROBE AMP APTIMA: NEGATIVE

## 2015-02-25 ENCOUNTER — Encounter: Payer: Self-pay | Admitting: Emergency Medicine

## 2015-09-08 ENCOUNTER — Ambulatory Visit (INDEPENDENT_AMBULATORY_CARE_PROVIDER_SITE_OTHER): Payer: BLUE CROSS/BLUE SHIELD

## 2015-09-08 ENCOUNTER — Ambulatory Visit (INDEPENDENT_AMBULATORY_CARE_PROVIDER_SITE_OTHER): Payer: BLUE CROSS/BLUE SHIELD | Admitting: Physician Assistant

## 2015-09-08 VITALS — BP 118/80 | HR 79 | Temp 98.3°F | Resp 18 | Ht 73.0 in | Wt 214.4 lb

## 2015-09-08 DIAGNOSIS — M25561 Pain in right knee: Secondary | ICD-10-CM

## 2015-09-08 NOTE — Patient Instructions (Addendum)
IF you received an x-ray today, you will receive an invoice from [facility]. Please contact [facility] at [phone] with questions or concerns regarding your invoice.   IF you received labwork today, you will receive an invoice from [employer]. Please contact [facility] at [phone] with questions or concerns regarding your invoice.   Our billing staff will not be able to assist you with questions regarding bills from these companies.  You will be contacted with the lab results as soon as they are available. The fastest way to get your results is to activate your My Chart account. Instructions are located on the last page of this paperwork. If you have not heard from [place] regarding the results in 2 weeks, please contact this office.     Please ice the knee three times per day.  I would like you to do this directly after you do your stretches (3 times per day) Take the mobic as prescribed.  Please do not take ibuprofen or naproxen.  You are able to take tylenol.    Generic Knee Exercises EXERCISES RANGE OF MOTION (ROM) AND STRETCHING EXERCISES These exercises may help you when beginning to rehabilitate your injury. Your symptoms may resolve with or without further involvement from your physician, physical therapist, or athletic trainer. While completing these exercises, remember:   Restoring tissue flexibility helps normal motion to return to the joints. This allows healthier, less painful movement and activity.  An effective stretch should be held for at least 30 seconds.  A stretch should never be painful. You should only feel a gentle lengthening or release in the stretched tissue. STRETCH - Knee Extension, Prone  Lie on your stomach on a firm surface, such as a bed or countertop. Place your right / left knee and leg just beyond the edge of the surface. You may wish to place a towel under the far end of your right / left thigh for  comfort.  Relax your leg muscles and allow gravity to straighten your knee. Your clinician may advise you to add an ankle weight if more resistance is helpful for you.  You should feel a stretch in the back of your right / left knee. Hold this position for __________ seconds. Repeat __________ times. Complete this stretch __________ times per day. * Your physician, physical therapist, or athletic trainer may ask you to add ankle weight to enhance your stretch.  RANGE OF MOTION - Knee Flexion, Active  Lie on your back with both knees straight. (If this causes back discomfort, bend your opposite knee, placing your foot flat on the floor.)  Slowly slide your heel back toward your buttocks until you feel a gentle stretch in the front of your knee or thigh.  Hold for __________ seconds. Slowly slide your heel back to the starting position. Repeat __________ times. Complete this exercise __________ times per day.  STRETCH - Quadriceps, Prone   Lie on your stomach on a firm surface, such as a bed or padded floor.  Bend your right / left knee and grasp your ankle. If you are unable to reach your ankle or pant leg, use a belt around your foot to lengthen your reach.  Gently pull your heel toward your buttocks. Your knee should not slide out to the side. You should feel a stretch in the front of your thigh and/or knee.  Hold this position for __________ seconds. Repeat __________ times. Complete this stretch __________ times per day.  STRETCH - Hamstrings,  Supine   Lie on your back. Loop a belt or towel over the ball of your right / left foot.  Straighten your right / left knee and slowly pull on the belt to raise your leg. Do not allow the right / left knee to bend. Keep your opposite leg flat on the floor.  Raise the leg until you feel a gentle stretch behind your right / left knee or thigh. Hold this position for __________ seconds. Repeat __________ times. Complete this stretch __________  times per day.  STRENGTHENING EXERCISES These exercises may help you when beginning to rehabilitate your injury. They may resolve your symptoms with or without further involvement from your physician, physical therapist, or athletic trainer. While completing these exercises, remember:   Muscles can gain both the endurance and the strength needed for everyday activities through controlled exercises.  Complete these exercises as instructed by your physician, physical therapist, or athletic trainer. Progress the resistance and repetitions only as guided.  You may experience muscle soreness or fatigue, but the pain or discomfort you are trying to eliminate should never worsen during these exercises. If this pain does worsen, stop and make certain you are following the directions exactly. If the pain is still present after adjustments, discontinue the exercise until you can discuss the trouble with your clinician. STRENGTH - Quadriceps, Isometrics  Lie on your back with your right / left leg extended and your opposite knee bent.  Gradually tense the muscles in the front of your right / left thigh. You should see either your knee cap slide up toward your hip or increased dimpling just above the knee. This motion will push the back of the knee down toward the floor/mat/bed on which you are lying.  Hold the muscle as tight as you can without increasing your pain for __________ seconds.  Relax the muscles slowly and completely in between each repetition. Repeat __________ times. Complete this exercise __________ times per day.  STRENGTH - Quadriceps, Short Arcs   Lie on your back. Place a __________ inch towel roll under your knee so that the knee slightly bends.  Raise only your lower leg by tightening the muscles in the front of your thigh. Do not allow your thigh to rise.  Hold this position for __________ seconds. Repeat __________ times. Complete this exercise __________ times per day.   OPTIONAL ANKLE WEIGHTS: Begin with ____________________, but DO NOT exceed ____________________. Increase in 1 pound/0.5 kilogram increments.  STRENGTH - Quadriceps, Straight Leg Raises  Quality counts! Watch for signs that the quadriceps muscle is working to insure you are strengthening the correct muscles and not "cheating" by substituting with healthier muscles.  Lay on your back with your right / left leg extended and your opposite knee bent.  Tense the muscles in the front of your right / left thigh. You should see either your knee cap slide up or increased dimpling just above the knee. Your thigh may even quiver.  Tighten these muscles even more and raise your leg 4 to 6 inches off the floor. Hold for __________ seconds.  Keeping these muscles tense, lower your leg.  Relax the muscles slowly and completely in between each repetition. Repeat __________ times. Complete this exercise __________ times per day.  STRENGTH - Hamstring, Curls  Lay on your stomach with your legs extended. (If you lay on a bed, your feet may hang over the edge.)  Tighten the muscles in the back of your thigh to bend your right /  left knee up to 90 degrees. Keep your hips flat on the bed/floor.  Hold this position for __________ seconds.  Slowly lower your leg back to the starting position. Repeat __________ times. Complete this exercise __________ times per day.  OPTIONAL ANKLE WEIGHTS: Begin with ____________________, but DO NOT exceed ____________________. Increase in 1 pound/0.5 kilogram increments.  STRENGTH - Quadriceps, Squats  Stand in a door frame so that your feet and knees are in line with the frame.  Use your hands for balance, not support, on the frame.  Slowly lower your weight, bending at the hips and knees. Keep your lower legs upright so that they are parallel with the door frame. Squat only within the range that does not increase your knee pain. Never let your hips drop below your  knees.  Slowly return upright, pushing with your legs, not pulling with your hands. Repeat __________ times. Complete this exercise __________ times per day.  STRENGTH - Quadriceps, Wall Slides  Follow guidelines for form closely. Increased knee pain often results from poorly placed feet or knees.  Lean against a smooth wall or door and walk your feet out 18-24 inches. Place your feet hip-width apart.  Slowly slide down the wall or door until your knees bend __________ degrees.* Keep your knees over your heels, not your toes, and in line with your hips, not falling to either side.  Hold for __________ seconds. Stand up to rest for __________ seconds in between each repetition. Repeat __________ times. Complete this exercise __________ times per day. * Your physician, physical therapist, or athletic trainer will alter this angle based on your symptoms and progress.   This information is not intended to replace advice given to you by your health care provider. Make sure you discuss any questions you have with your health care provider.   Document Released: 03/24/2005 Document Revised: 05/31/2014 Document Reviewed: 08/22/2008 Elsevier Interactive Patient Education [employer].

## 2015-09-09 ENCOUNTER — Other Ambulatory Visit: Payer: Self-pay | Admitting: *Deleted

## 2015-09-09 DIAGNOSIS — M25561 Pain in right knee: Secondary | ICD-10-CM

## 2015-09-09 MED ORDER — MELOXICAM 15 MG PO TABS: 15.0000 mg | ORAL_TABLET | Freq: Every day | ORAL | Status: DC

## 2015-10-06 NOTE — Progress Notes (Signed)
[facility] [address], [place] [address] [phone]  Date:  09/08/2015   Name:  [name]   DOB:  1985-06-24   MRN:  [id number]  PCP:  Jenny Reichmann, MD   Chief Complaint  Patient presents with  . Knee Injury    right knee, hurts when exercising and moving around. No swelling.    History of Present Illness:  [name] is a 30 y.o. male patient who presents to [facility] for cc of right knee pain.  Right knee pain for about 2 months.  This has been more prominent with exercise such as squatting.it is more above the patella and the lateral upper side.  He has had minimal swelling that he may have noticed.  There is no radiating pain, numbness or tingling.  He has no warmth or redness.  The pain resolves with ease and stopping exercise.  He denies any recent trauma, but injured it about 15 years ago.       Patient Active Problem List   Diagnosis Date Noted  . Paroxysmal atrial fibrillation ([place]) 12/27/2012    Past Medical History  Diagnosis Date  . Mitral valve prolapse   . Paroxysmal atrial fibrillation ([facility])   . Heart murmur   . A-fib [facility])     Past Surgical History  Procedure Laterality Date  . Wrist surgery  05/2004    tendon repair  . Surgery scrotal / testicular  06/1994    torsion  . US echocardiography  10/06/2010    LA mildly dilated,trace MR  . Nm myocar perf wall motion  12/11/2004    [place], MD: no ischemia    Social History  Substance Use Topics  . Smoking status: Former Smoker    Types: Cigars  . Smokeless tobacco: Never Used  . Alcohol Use: Yes     Comment: social    Family History  Problem Relation Age of Onset  . Cancer Mother   . Diabetes Father   . Hypertension Father   . Hyperlipidemia Father   . Cancer Maternal Grandmother   . Cancer Paternal Grandmother   . Cancer Paternal Grandfather     No Known Allergies  Medication list has been reviewed and updated.  Current Outpatient Prescriptions on  File Prior to Visit  Medication Sig Dispense Refill  . sildenafil (VIAGRA) 100 MG tablet Take 0.5-1 tablets (50-100 mg total) by mouth daily as needed for erectile dysfunction. 15 tablet 1  . aspirin 81 MG tablet Take 81 mg by mouth daily. Reported on 09/08/2015    . testosterone (ANDROGEL) 50 MG/5GM (1%) GEL Place 5 g onto the skin daily. (Patient not taking: Reported on 09/08/2015) 90 Tube 0   No current facility-administered medications on file prior to visit.    ROS ROS otherwise unremarkable unless listed above.  Physical Examination: BP 118/80 mmHg  Pulse 79  Temp(Src) 98.3 F (36.8 C) (Oral)  Resp 18  Ht 6\' 1"  (1.854 m)  Wt 214 lb 6.4 oz (97.251 kg)  BMI 28.29 kg/m2  SpO2 98% Ideal Body Weight: Weight in (lb) to have BMI = 25: 189.1  Physical Exam  Constitutional: He is oriented to person, place, and time. He appears well-developed and well-nourished. No distress.  HENT:  Head: Normocephalic and atraumatic.  Eyes: Conjunctivae and EOM are normal. Pupils are equal, round, and reactive to light.  Cardiovascular: Normal rate.   Pulmonary/Chest: Effort normal. No respiratory distress.  Musculoskeletal:  Right knee: He exhibits normal range of motion, no swelling, no effusion, no LCL laxity, no bony tenderness and no MCL laxity. Tenderness found. Lateral joint line tenderness noted. No medial joint line, no MCL and no LCL tenderness noted.  Neurological: He is alert and oriented to person, place, and time. No cranial nerve deficit. Coordination normal.  Skin: Skin is warm and dry. He is not diaphoretic.  Psychiatric: He has a normal mood and affect. His behavior is normal.   Dg Knee Complete 4 Views Right  09/08/2015  CLINICAL DATA:  Right knee pain EXAM: RIGHT KNEE - COMPLETE 4+ VIEW COMPARISON:  None. FINDINGS: No fracture or dislocation is seen. The joint spaces are preserved. Visualized soft tissues are within normal limits. No definite suprapatellar knee effusion.  IMPRESSION: No acute osseus abnormality is seen. Electronically Signed   By: Julian Hy M.D.   On: 09/08/2015 18:44   Assessment and Plan: [name] is a 30 y.o. male who is here today for right knee pain. ice the knee three times per day. Stretches, and short term use of anti-inflammatory.  Advised to return if symptoms do not improve in the next 10 days.    1. Right knee pain - DG Knee Complete 4 Views Right   [name], PA-C [facility] 10/06/2015 3:50 PM

## 2015-12-26 ENCOUNTER — Encounter: Payer: Self-pay | Admitting: Podiatry

## 2015-12-26 ENCOUNTER — Ambulatory Visit (INDEPENDENT_AMBULATORY_CARE_PROVIDER_SITE_OTHER): Payer: BLUE CROSS/BLUE SHIELD | Admitting: Podiatry

## 2015-12-26 VITALS — BP 135/86 | HR 64 | Resp 16 | Ht 73.0 in | Wt 215.0 lb

## 2015-12-26 DIAGNOSIS — M795 Residual foreign body in soft tissue: Secondary | ICD-10-CM | POA: Diagnosis not present

## 2015-12-26 DIAGNOSIS — B353 Tinea pedis: Secondary | ICD-10-CM

## 2015-12-26 DIAGNOSIS — M79672 Pain in left foot: Secondary | ICD-10-CM

## 2015-12-26 MED ORDER — TERBINAFINE HCL 250 MG PO TABS: 250.0000 mg | ORAL_TABLET | Freq: Every day | ORAL | 0 refills | Status: AC

## 2015-12-26 MED ORDER — CLOTRIMAZOLE-BETAMETHASONE 1-0.05 % EX CREA: TOPICAL_CREAM | Freq: Two times a day (BID) | CUTANEOUS | Status: AC

## 2015-12-26 NOTE — Progress Notes (Signed)
   Subjective:    Patient ID: [name], male    DOB: March 18, 1986, 30 y.o.   MRN: [id number]  HPI Chief Complaint  Patient presents with  . Painful lesion    Left foot; midfoot-above heel; pt stated, "At first pain felt like had a splinter; pain has increased over time"      Review of Systems  Cardiovascular: Positive for palpitations.  All other systems reviewed and are negative.      Objective:   Physical Exam        Assessment & Plan:

## 2015-12-28 NOTE — Progress Notes (Signed)
Subjective:     Patient ID: [name], male   DOB: 02/24/1986, 30 y.o.   MRN: [id number]  HPI patient presents stating I have had a lot of pain in the bottom of my left foot and it feels like I may have stepped on something. Also I have it she feet   Review of Systems  All other systems reviewed and are negative.      Objective:   Physical Exam  Constitutional: He is oriented to person, place, and time.  Cardiovascular: Intact distal pulses.   Musculoskeletal: Normal range of motion.  Neurological: He is oriented to person, place, and time.  Skin: Skin is warm.  Nursing note and vitals reviewed.  neurovascular status intact muscle strength adequate range of motion within normal limits with patient found to have discomfort plantar foot with a small indication that there was a foreign body entrance into this area. It is localized and is not showing signs of proximal edema erythema drainage and patient has good digital perfusion and is well oriented     Assessment:     Probable foreign body plantar aspect left foot    Plan:     H&P condition reviewed in numbing applied around the area. Sterile application of medicine applied to the area and then using sterile instrumentation was carefully opened up and a small piece of glass was removed from the plantar foot. It was flushed with dressing applied and soaks will be initiated and patient will come back if any redness drainage or if the symptoms were to recur or persist

## 2016-02-18 ENCOUNTER — Ambulatory Visit (INDEPENDENT_AMBULATORY_CARE_PROVIDER_SITE_OTHER): Payer: BLUE CROSS/BLUE SHIELD | Admitting: Physician Assistant

## 2016-02-18 ENCOUNTER — Ambulatory Visit (INDEPENDENT_AMBULATORY_CARE_PROVIDER_SITE_OTHER): Payer: BLUE CROSS/BLUE SHIELD

## 2016-02-18 VITALS — BP 118/80 | HR 90 | Temp 98.2°F | Resp 17 | Ht 73.0 in | Wt 218.0 lb

## 2016-02-18 DIAGNOSIS — M25552 Pain in left hip: Secondary | ICD-10-CM | POA: Diagnosis not present

## 2016-02-18 MED ORDER — MELOXICAM 15 MG PO TABS: 15.0000 mg | ORAL_TABLET | Freq: Every day | ORAL | 0 refills | Status: DC

## 2016-02-18 NOTE — Patient Instructions (Addendum)
Please ice this area three times per day for 15 minutes.  I would like you to do small stretches over the next week, but no excessive exercise.  If your symptoms do not improve, further imaging may have to be considered.     IF you received an x-ray today, you will receive an invoice from [facility]. Please contact [facility] at [phone] with questions or concerns regarding your invoice.   IF you received labwork today, you will receive an invoice from [employer]. Please contact [facility] at [phone] with questions or concerns regarding your invoice.   Our billing staff will not be able to assist you with questions regarding bills from these companies.  You will be contacted with the lab results as soon as they are available. The fastest way to get your results is to activate your My Chart account. Instructions are located on the last page of this paperwork. If you have not heard from [place] regarding the results in 2 weeks, please contact this office.

## 2016-02-25 ENCOUNTER — Ambulatory Visit (INDEPENDENT_AMBULATORY_CARE_PROVIDER_SITE_OTHER): Payer: BLUE CROSS/BLUE SHIELD | Admitting: Physician Assistant

## 2016-02-25 VITALS — BP 116/80 | HR 82 | Temp 98.4°F | Resp 16 | Ht 73.0 in | Wt 218.0 lb

## 2016-02-25 DIAGNOSIS — M25552 Pain in left hip: Secondary | ICD-10-CM

## 2016-02-25 NOTE — Patient Instructions (Addendum)
  I am placing a referral to ortho.  In the meantime continue icing as you are doing.  You can also try a warm epsom salt bath in the meantime.    IF you received an x-ray today, you will receive an invoice from [facility]. Please contact [facility] at [phone] with questions or concerns regarding your invoice.   IF you received labwork today, you will receive an invoice from [employer]. Please contact [facility] at [phone] with questions or concerns regarding your invoice.   Our billing staff will not be able to assist you with questions regarding bills from these companies.  You will be contacted with the lab results as soon as they are available. The fastest way to get your results is to activate your My Chart account. Instructions are located on the last page of this paperwork. If you have not heard from [place] regarding the results in 2 weeks, please contact this office.

## 2016-02-25 NOTE — Progress Notes (Signed)
[facility] [address], [place] [address] [phone]  By signing my name below, I, [name], attest that this documentation has been prepared under the direction and in the presence of [place], PA-C. Electronically Signed: Verlee Monte, Medical Scribe. 02/25/16. 9:59 AM.  Date:  02/25/2016   Name:  [name]   DOB:  June 20, 1985   MRN:  [id number]  PCP:  Jenny Reichmann, MD   Chief Complaint  Patient presents with   Follow-up    Left leg pain    History of Present Illness:  [name] is a 30 y.o. male patient who presents to [facility] left leg pain follow-up. Pt was running straight to 2nd base when he felt a pop, and fell down. Pt states his leg pain has been improving since the last time but he can still feel residual pain from the accident. Pt reports shooting pain from his hip to his thigh when his thigh is tucked under more than usual while sitting, when he's standing and his foot is tucked under, walking down hill, or switching position when laying down, or when he leads with his left leg. Pt ices his leg 2x a day, stretching his leg, taking meloxicam in the morning for relief to his symptoms. Pt states kickball tournament next week and playoffs are right after that and he would like to sprint. Pt denies pain with flexing his leg.  Patient Active Problem List   Diagnosis Date Noted   Paroxysmal atrial fibrillation ([place]) 12/27/2012    Past Medical History:  Diagnosis Date   A-fib ([address])    Heart murmur    Mitral valve prolapse    Paroxysmal atrial fibrillation ([facility])     Past Surgical History:  Procedure Laterality Date   NM MYOCAR PERF WALL MOTION  12/11/2004   [place], MD: no ischemia   SURGERY SCROTAL / TESTICULAR  06/1994   torsion   US ECHOCARDIOGRAPHY  10/06/2010   LA mildly dilated,trace MR   WRIST SURGERY  05/2004   tendon repair    Social History  Substance Use Topics   Smoking status: Former  Smoker    Types: Cigars   Smokeless tobacco: Never Used   Alcohol use Yes     Comment: social    Family History  Problem Relation Age of Onset   Cancer Mother    Diabetes Father    Hypertension Father    Hyperlipidemia Father    Cancer Maternal Grandmother    Cancer Paternal Grandmother    Cancer Paternal Grandfather     No Known Allergies  Medication list has been reviewed and updated.  Current Outpatient Prescriptions on File Prior to Visit  Medication Sig Dispense Refill   aspirin 81 MG tablet Take 81 mg by mouth daily. Reported on 09/08/2015     meloxicam (MOBIC) 15 MG tablet Take 1 tablet (15 mg total) by mouth daily. 14 tablet 0   No current facility-administered medications on file prior to visit.     Review of Systems  Musculoskeletal: Positive for joint pain (hip).    Physical Examination: BP 116/80    Pulse 82    Temp 98.4 F (36.9 C) (Oral)    Resp 16    Ht 6\' 1"  (1.854 m)    Wt 218 lb (98.9 kg)    SpO2 98%    BMI 28.76 kg/m  Ideal Body Weight: @FLOWAMB [id number]  Physical Exam  Constitutional: He is oriented to person,  place, and time. He appears well-developed and well-nourished. No distress.  HENT:  Head: Normocephalic and atraumatic.  Eyes: Conjunctivae and EOM are normal. Pupils are equal, round, and reactive to light.  Cardiovascular: Normal rate.   Pulmonary/Chest: Effort normal. No respiratory distress.  Musculoskeletal: He exhibits no edema or deformity.  Left hip: tender over the hip flexor that comes around laterally around the trocantor No obvious swelling or deformity Pain with active flexion and extension of the hip  Neurological: He is alert and oriented to person, place, and time.  Skin: Skin is warm and dry. He is not diaphoretic.  Psychiatric: He has a normal mood and affect. His behavior is normal.   Assessment and Plan: [name] is a 30 y.o. male who is here today For follow-up of his left leg and hip  pain. This is only been 7 days for this acute change. Advised to continue with the anti-inflammatory and icing the area. He can also try doing a warm bath. At this time we will proceed with ortho visit for consult.  Thank you performing this evaluation.  Possible tear though this just may be inflammation that has not quite healed yet. If his symptoms improve, I have advised him to cancel this [facility] appointment. He has voiced understanding. Pain of left hip joint - Plan: AMB referral to orthopedics  [name], PA-C [facility] 10/4/20174:19 PM I personally performed the services described in this documentation, which was scribed in my presence. The recorded information has been reviewed and is accurate.

## 2016-03-06 NOTE — Progress Notes (Signed)
[facility] [address], [place] [address] [phone]  Date:  02/18/2016   Name:  [name]   DOB:  06-30-1985   MRN:  [id number]  PCP:  Jenny Reichmann, MD   Chief Complaint  Patient presents with  . Leg Injury    Left. Kickball injury last night.     History of Present Illness:  [name] is a 30 y.o. male patient who presents to [facility] for cc of left hip pain.  Patient was playing kickball last night when he was running and full sprint and felt a pop at his left hip. This sent him dropping to the ground in pain. Since that time he has had pain with in the sort of climbing up and down the stairs. However he is able to ambulate on this left leg. He denies any swelling. There is no instability of this left leg. He points to the pain and it side of his hip. This radiates down into the thigh just above the knee. Patient denies any numbness or tingling. Patient has never had an injury in this location. .   Patient Active Problem List   Diagnosis Date Noted  . Paroxysmal atrial fibrillation ([place]) 12/27/2012    Past Medical History:  Diagnosis Date  . A-fib ([place])   . Heart murmur   . Mitral valve prolapse   . Paroxysmal atrial fibrillation [facility])     Past Surgical History:  Procedure Laterality Date  . NM MYOCAR PERF WALL MOTION  12/11/2004   [place], MD: no ischemia  . SURGERY SCROTAL / TESTICULAR  06/1994   torsion  . US ECHOCARDIOGRAPHY  10/06/2010   [place] mildly dilated,trace MR  . WRIST SURGERY  05/2004   tendon repair    Social History  Substance Use Topics  . Smoking status: Former Smoker    Types: Cigars  . Smokeless tobacco: Never Used  . Alcohol use Yes     Comment: social    Family History  Problem Relation Age of Onset  . Cancer Mother   . Diabetes Father   . Hypertension Father   . Hyperlipidemia Father   . Cancer Maternal Grandmother   . Cancer Paternal Grandmother   . Cancer Paternal Grandfather     No Known  Allergies  Medication list has been reviewed and updated.  Current Outpatient Prescriptions on File Prior to Visit  Medication Sig Dispense Refill  . aspirin 81 MG tablet Take 81 mg by mouth daily. Reported on 09/08/2015     No current facility-administered medications on file prior to visit.     ROS ROS otherwise unremarkable unless listed above.  Physical Examination: BP 118/80 (BP Location: Left Arm, Patient Position: Sitting, Cuff Size: Large)   Pulse 90   Temp 98.2 F (36.8 C) (Oral)   Resp 17   Ht 6\' 1"  (1.854 m)   Wt 218 lb (98.9 kg)   SpO2 98%   BMI 28.76 kg/m  Ideal Body Weight: Weight in (lb) to have BMI = 25: 189.1  Physical Exam  Constitutional: He is oriented to person, place, and time. He appears well-developed and well-nourished. No distress.  HENT:  Head: Normocephalic and atraumatic.  Eyes: Conjunctivae and EOM are normal. Pupils are equal, round, and reactive to light.  Cardiovascular: Normal rate.   Pulmonary/Chest: Effort normal. No respiratory distress.  Musculoskeletal:  There is tenderness at the left lateral side of hip and into the trochanter. There is  mild pain incited with internal rotation of the hip. No crepitus. Patient has normal strength with flexion and extension. No spasm can be appreciated. There is no muscular deformity or obvious sign of complete tear of musculature.  Neurological: He is alert and oriented to person, place, and time.  Skin: Skin is warm and dry. He is not diaphoretic.  Psychiatric: He has a normal mood and affect. His behavior is normal.   Dg Hip Unilat W Or W/o Pelvis 2-3 Views Left  Result Date: 02/18/2016 CLINICAL DATA:  Left hip pain wall riding EXAM: DG HIP (WITH OR WITHOUT PELVIS) 2-3V LEFT COMPARISON:  None. FINDINGS: There is no evidence of hip fracture or dislocation. There is no evidence of arthropathy or other focal bone abnormality. IMPRESSION: Negative. Electronically Signed   By: Franchot Gallo M.D.   On:  02/18/2016 10:52    Assessment and Plan: [name] is a 30 y.o. male who is here today for left hip pain.   Patient was given an anti-inflammatory at this time. Have advised icing 3 times a day for 15 minutes. Patient was given stretches to perform after 24 hours. He'll return to clinic if no improvement within the next 7 days. Advised to return sooner if pain worsens. Left hip pain - Plan: DG HIP UNILAT W OR W/O PELVIS 2-3 VIEWS LEFT    [name], PA-C [facility] 03/06/2016 8:58 AM

## 2016-05-21 ENCOUNTER — Ambulatory Visit (INDEPENDENT_AMBULATORY_CARE_PROVIDER_SITE_OTHER): Payer: BLUE CROSS/BLUE SHIELD | Admitting: Physician Assistant

## 2016-05-21 VITALS — BP 112/68 | HR 88 | Temp 97.1°F | Resp 18 | Ht 73.0 in | Wt 220.0 lb

## 2016-05-21 DIAGNOSIS — J988 Other specified respiratory disorders: Secondary | ICD-10-CM | POA: Diagnosis not present

## 2016-05-21 MED ORDER — BENZONATATE 100 MG PO CAPS: 100.0000 mg | ORAL_CAPSULE | Freq: Three times a day (TID) | ORAL | 0 refills | Status: DC | PRN

## 2016-05-21 MED ORDER — AMOXICILLIN-POT CLAVULANATE 875-125 MG PO TABS: 1.0000 | ORAL_TABLET | Freq: Two times a day (BID) | ORAL | 0 refills | Status: AC

## 2016-05-21 MED ORDER — HYDROCOD POLST-CPM POLST ER 10-8 MG/5ML PO SUER: 5.0000 mL | Freq: Every evening | ORAL | 0 refills | Status: DC | PRN

## 2016-05-21 NOTE — Progress Notes (Signed)
[facility] [address], [place] [address] [phone]  Date:  05/21/2016   Name:  [name]   DOB:  November 15, 1985   MRN:  [id number]  PCP:  Jenny Reichmann, MD    History of Present Illness:  [name] is a 30 y.o. male patient who presents to [facility] cough and sore throat.  Sore throat 10 days ago. He had fever 3 days later at 100.9. Cough and sore throat, for 5 days.  He continues to have a cough ythat is worsened at night.  He is unable to get rest.  Headache secondary to the coughing.  Pain at left side of rib secondary to coughing.  Last 2 mornings, blood in the mouth.  Blood in mucus of nose when blown.  Nasal congestion without facial pain at this time.  No sob or dyspnea.  He is taking mucinex, and cough drops and herbal tea--soothes but temporary.         Patient Active Problem List   Diagnosis Date Noted  . Paroxysmal atrial fibrillation ([place]) 12/27/2012    Past Medical History:  Diagnosis Date  . A-fib ([place])   . Heart murmur   . Mitral valve prolapse   . Paroxysmal atrial fibrillation [facility])     Past Surgical History:  Procedure Laterality Date  . NM MYOCAR PERF WALL MOTION  12/11/2004   [place], MD: no ischemia  . SURGERY SCROTAL / TESTICULAR  06/1994   torsion  . US ECHOCARDIOGRAPHY  10/06/2010   [place] mildly dilated,trace MR  . WRIST SURGERY  05/2004   tendon repair    Social History  Substance Use Topics  . Smoking status: Former Smoker    Types: Cigars  . Smokeless tobacco: Never Used  . Alcohol use Yes     Comment: social    Family History  Problem Relation Age of Onset  . Cancer Mother   . Diabetes Father   . Hypertension Father   . Hyperlipidemia Father   . Cancer Maternal Grandmother   . Cancer Paternal Grandmother   . Cancer Paternal Grandfather     No Known Allergies  Medication list has been reviewed and updated.  No current outpatient prescriptions on file prior to visit.   No current  facility-administered medications on file prior to visit.     ROS ROS otherwise unremarkable unless listed above.   Physical Examination: BP 112/68   Pulse 88   Temp 97.1 F (36.2 C) (Oral)   Resp 18   Ht 6\' 1"  (1.854 m)   Wt 220 lb (99.8 kg)   SpO2 97%   BMI 29.03 kg/m  Ideal Body Weight: Weight in (lb) to have BMI = 25: 189.1  Physical Exam  Constitutional: He is oriented to person, place, and time. He appears well-developed and well-nourished. No distress.  HENT:  Head: Normocephalic and atraumatic.  Right Ear: Tympanic membrane, external ear and ear canal normal.  Left Ear: Tympanic membrane, external ear and ear canal normal.  Nose: Mucosal edema and rhinorrhea present. Right sinus exhibits no maxillary sinus tenderness and no frontal sinus tenderness. Left sinus exhibits no maxillary sinus tenderness and no frontal sinus tenderness.  Mouth/Throat: No uvula swelling. Posterior oropharyngeal erythema present. No oropharyngeal exudate or posterior oropharyngeal edema.  Eyes: Conjunctivae, EOM and lids are normal. Pupils are equal, round, and reactive to light. Right eye exhibits normal extraocular motion. Left eye exhibits normal extraocular motion.  Neck: Trachea normal and  full passive range of motion without pain. No edema and no erythema present.  Cardiovascular: Normal rate.   Pulmonary/Chest: Effort normal. No respiratory distress. He has no decreased breath sounds. He has no wheezes. He has no rhonchi.  Neurological: He is alert and oriented to person, place, and time.  Skin: Skin is warm and dry. He is not diaphoretic.  Psychiatric: He has a normal mood and affect. His behavior is normal.     Assessment and Plan: [name] is a 30 y.o. male who is here today for cc of cough. Viral symptoms.  Respiratory infection - Plan: chlorpheniramine-HYDROcodone (TUSSIONEX PENNKINETIC ER) 10-8 MG/5ML SUER, benzonatate (TESSALON) 100 MG capsule  [name],  PA-C [facility] 05/21/2016 1:48 PM

## 2016-05-21 NOTE — Patient Instructions (Addendum)
  mucinex 1200mg  every 12 hours.  Please continue to hydrate well with water. Take antibiotic as prescribed    IF you received an x-ray today, you will receive an invoice from [facility]. Please contact [facility] at [phone] with questions or concerns regarding your invoice.   IF you received labwork today, you will receive an invoice from [place]. Please contact [facility] at [phone] with questions or concerns regarding your invoice.   Our billing staff will not be able to assist you with questions regarding bills from these companies.  You will be contacted with the lab results as soon as they are available. The fastest way to get your results is to activate your My Chart account. Instructions are located on the last page of this paperwork. If you have not heard from [place] regarding the results in 2 weeks, please contact this office.

## 2016-07-08 ENCOUNTER — Ambulatory Visit (INDEPENDENT_AMBULATORY_CARE_PROVIDER_SITE_OTHER): Payer: BLUE CROSS/BLUE SHIELD | Admitting: Physician Assistant

## 2016-07-08 VITALS — BP 118/82 | HR 79 | Temp 98.2°F | Resp 18 | Ht 73.0 in | Wt 223.0 lb

## 2016-07-08 DIAGNOSIS — M25552 Pain in left hip: Secondary | ICD-10-CM | POA: Diagnosis not present

## 2016-07-08 DIAGNOSIS — S76912A Strain of unspecified muscles, fascia and tendons at thigh level, left thigh, initial encounter: Secondary | ICD-10-CM | POA: Diagnosis not present

## 2016-07-08 MED ORDER — MELOXICAM 15 MG PO TABS: 15.0000 mg | ORAL_TABLET | Freq: Every day | ORAL | 0 refills | Status: DC

## 2016-07-08 NOTE — Progress Notes (Signed)
[facility] [address] [place] [address] [phone]  Date:  07/08/2016   Name:  [name]   DOB:  [birth year]   MRN:  [id number]  PCP:  Jenny Reichmann, MD    History of Present Illness:  [name] is a 31 y.o. male patient who presents to [facility] for cc of left hip pain. Patient is here for recurrent hip pain.  He was seen here for hip pain 4.5 months ago.  Referral was sent to ortho when pain was not improving.  He states that he was advised it was a muscle strain and if he was not having improvement, to return in 3 months.  Pain has not improved unless he does not perform any activity.  He attempted to run 3 days ago, and the pain restarted at the area of where the hip and leg meet.  There is no radiating pain down the entire leg, or numbness or tingling.  He can palpate the pain, however no swelling noted.  He has not been performing any exercise.  He is concerned that this will withhold him from playing recreational sports such as kickball.   Wt Readings from Last 3 Encounters:  07/08/16 223 lb (101.2 kg)  05/21/16 220 lb (99.8 kg)  02/25/16 218 lb (98.9 kg)        Patient Active Problem List   Diagnosis Date Noted  . Paroxysmal atrial fibrillation ([place]) 12/27/2012    Past Medical History:  Diagnosis Date  . A-fib ([place])   . Heart murmur   . Mitral valve prolapse   . Paroxysmal atrial fibrillation [facility])     Past Surgical History:  Procedure Laterality Date  . NM MYOCAR PERF WALL MOTION  12/11/2004   [place], MD: no ischemia  . SURGERY SCROTAL / TESTICULAR  06/1994   torsion  . US ECHOCARDIOGRAPHY  10/06/2010   [place] mildly dilated,trace MR  . WRIST SURGERY  05/2004   tendon repair    Social History  Substance Use Topics  . Smoking status: Former Smoker    Types: Cigars  . Smokeless tobacco: Never Used  . Alcohol use Yes     Comment: social    Family History  Problem Relation Age of Onset  . Cancer Mother   . Diabetes  Father   . Hypertension Father   . Hyperlipidemia Father   . Cancer Maternal Grandmother   . Cancer Paternal Grandmother   . Cancer Paternal Grandfather     No Known Allergies  Medication list has been reviewed and updated.  No current outpatient prescriptions on file prior to visit.   No current facility-administered medications on file prior to visit.     ROS Ros otherwise unremarkable uness listed abov.e   Physical Examination: BP 118/82   Pulse 79   Temp 98.2 F (36.8 C) (Oral)   Resp 18   Ht 6\' 1"  (1.854 m)   Wt 223 lb (101.2 kg)   SpO2 99%   BMI 29.42 kg/m  Ideal Body Weight: Weight in (lb) to have BMI = 25: 189.1  Physical Exam  Constitutional: He is oriented to person, place, and time. He appears well-developed and well-nourished. No distress.  HENT:  Head: Normocephalic and atraumatic.  Eyes: Conjunctivae and EOM are normal. Pupils are equal, round, and reactive to light.  Cardiovascular: Normal rate.   Pulmonary/Chest: Effort normal. No respiratory distress.  Musculoskeletal:       Left hip: He exhibits  tenderness (anterior proximal thigh tenderness. pain with resisted aDduction.  ). He exhibits normal range of motion, normal strength and no swelling.  Neurological: He is alert and oriented to person, place, and time.  Skin: Skin is warm and dry. He is not diaphoretic.  Psychiatric: He has a normal mood and affect. His behavior is normal.     Assessment and Plan: [name] is a 31 y.o. male who is here today for cc of hip pain. This is recurrent.  He will follow up with ortho.  Likely get MRI ordered as this is not getting better.  This may be partial tear that is not improving with deconditioning and the need for physical therapy. Given mobic and discussed precaution. Contacted ortho with appt in 4 days at 8am Muscle strain of left thigh, initial encounter  Pain of left hip joint  [name], PA-C [facility] 2/15/20186:26 PM

## 2016-07-08 NOTE — Patient Instructions (Addendum)
  appt 8am with Richardson Landry.   Please ice the left hip three times per day for 15 minutes.      IF you received an x-ray today, you will receive an invoice from [facility]. Please contact [facility] at [phone] with questions or concerns regarding your invoice.   IF you received labwork today, you will receive an invoice from [facility]. Please contact [facility] at [phone] with questions or concerns regarding your invoice.   Our billing staff will not be able to assist you with questions regarding bills from these companies.  You will be contacted with the lab results as soon as they are available. The fastest way to get your results is to activate your My Chart account. Instructions are located on the last page of this paperwork. If you have not heard from [place] regarding the results in 2 weeks, please contact this office.

## 2016-12-13 ENCOUNTER — Ambulatory Visit (INDEPENDENT_AMBULATORY_CARE_PROVIDER_SITE_OTHER): Payer: Managed Care, Other (non HMO) | Admitting: Family Medicine

## 2016-12-13 ENCOUNTER — Telehealth: Payer: Self-pay | Admitting: Family Medicine

## 2016-12-13 ENCOUNTER — Encounter: Payer: Self-pay | Admitting: Family Medicine

## 2016-12-13 VITALS — BP 116/81 | HR 69 | Temp 98.8°F | Resp 16 | Ht 73.0 in | Wt 218.0 lb

## 2016-12-13 DIAGNOSIS — Z13220 Encounter for screening for lipoid disorders: Secondary | ICD-10-CM | POA: Diagnosis not present

## 2016-12-13 DIAGNOSIS — Z130 Encounter for screening for diseases of the blood and blood-forming organs and certain disorders involving the immune mechanism: Secondary | ICD-10-CM | POA: Diagnosis not present

## 2016-12-13 DIAGNOSIS — Z131 Encounter for screening for diabetes mellitus: Secondary | ICD-10-CM

## 2016-12-13 DIAGNOSIS — I480 Paroxysmal atrial fibrillation: Secondary | ICD-10-CM

## 2016-12-13 DIAGNOSIS — E291 Testicular hypofunction: Secondary | ICD-10-CM

## 2016-12-13 DIAGNOSIS — Z0000 Encounter for general adult medical examination without abnormal findings: Secondary | ICD-10-CM | POA: Diagnosis not present

## 2016-12-13 DIAGNOSIS — Z125 Encounter for screening for malignant neoplasm of prostate: Secondary | ICD-10-CM

## 2016-12-13 DIAGNOSIS — N529 Male erectile dysfunction, unspecified: Secondary | ICD-10-CM

## 2016-12-13 DIAGNOSIS — Z1329 Encounter for screening for other suspected endocrine disorder: Secondary | ICD-10-CM

## 2016-12-13 MED ORDER — SILDENAFIL CITRATE 100 MG PO TABS: 50.0000 mg | ORAL_TABLET | Freq: Every day | ORAL | 11 refills | Status: DC | PRN

## 2016-12-13 MED ORDER — TESTOSTERONE 50 MG/5GM (1%) TD GEL: 5.0000 g | Freq: Every day | TRANSDERMAL | 2 refills | Status: DC

## 2016-12-13 NOTE — Telephone Encounter (Signed)
Pt called referrals saying he was seen today and is requesting a cardiology referral be placed for [place]. Please advise. Thanks!

## 2016-12-13 NOTE — Progress Notes (Addendum)
Subjective:  By signing my name below, I, Essence Howell, attest that this documentation has been prepared under the direction and in the presence of Wendie Agreste, MD Electronically Signed: Ladene Artist, ED Scribe 12/13/2016 at 8:59 AM.   Patient ID: [name], male    DOB: 1986/03/17, 31 y.o.   MRN: [id number]  Chief Complaint  Patient presents with  . Annual Exam    Surgery on hip 8/20  . Medication Refill    Viagra and androgel   HPI IZMAEL DUROSS is a 31 y.o. male who presents to Primary Care at [facility] for multiple concerns; an annual exam, planned hip surgery and medication refills. Pt is fasting at this visit.  Planned hip surgery 8/20. Followed at [facility] by Dr. Kara Dies. Tear of his left hip labrum.   Hypogonadism Treated with 1 packet of androgel daily in the past. Last testosterone level in 2016. Low at 185 at that time off of medication. Advised to follow-up in 6 weeks on meds. Has not had repeat testing. Has used half tablet of Viagra for ED but is requesting a refill at this visit. Pt denies side-effects of Viagra other than HAs with alcohol consumption. H//o testicular torsion at age 2 requiring surgery.  Lab Results  Component Value Date   PSA 0.74 09/30/2014   PSA 0.57 06/08/2014   PSA 0.78 06/27/2012   Atrial Fibrillation  Last seen by cardiology in 2013. Pt is not currently taking medications for a-fib. He states he has not been in a-fib in "a while" but reports smaller episodes of a-fib 1-2 times/year that last for approximately 1 hour with some associated light-headedness.  CA Screening Skin CA: No family h/o skin CA or removal of moles.   Immunizations Immunization History  Administered Date(s) Administered  . Influenza-Unspecified 03/14/2015  . Tdap 09/30/2014  HIV Screening: Declined STI testing at this time.   Depression Screening Depression screen [facility] 2/9 12/13/2016 07/08/2016 05/21/2016 02/25/2016 02/18/2016  Decreased Interest 0 0 0 0 0   Down, Depressed, Hopeless 0 0 0 0 0  PHQ - 2 Score 0 0 0 0 0     Visual Acuity Screening   Right eye Left eye Both eyes  Without correction: 20/15 20/15 20/13   With correction:     Vision: Does not wear glasses or contacts. Dentist: Followed by dentist every 6 months. Exercise: Has not been able to exercise as much as he wants due to left hip labrum tear.  Patient Active Problem List   Diagnosis Date Noted  . Paroxysmal atrial fibrillation ([place]) 12/27/2012   Past Medical History:  Diagnosis Date  . A-fib ([place])   . Heart murmur   . Mitral valve prolapse   . Paroxysmal atrial fibrillation [facility])    Past Surgical History:  Procedure Laterality Date  . NM MYOCAR PERF WALL MOTION  12/11/2004   [place], MD: no ischemia  . SURGERY SCROTAL / TESTICULAR  06/1994   torsion  . US ECHOCARDIOGRAPHY  10/06/2010   LA mildly dilated,trace MR  . WRIST SURGERY  05/2004   tendon repair   No Known Allergies Prior to Admission medications   Medication Sig Start Date End Date Taking? Authorizing Provider  meloxicam (MOBIC) 15 MG tablet Take 1 tablet (15 mg total) by mouth daily. 07/08/16   Joretta Bachelor, PA   Social History   Social History  . Marital status: Single    Spouse name: N/A  . Number of children: N/A  .  Years of education: N/A   Occupational History  . Not on file.   Social History Main Topics  . Smoking status: Former Smoker    Types: Cigars  . Smokeless tobacco: Never Used  . Alcohol use Yes     Comment: social  . Drug use: No  . Sexual activity: Yes    Birth control/ protection: None   Other Topics Concern  . Not on file   Social History Narrative  . No narrative on file   Review of Systems  All other systems reviewed and are negative. 13 point ROS reviewed on patient survey, no positive responses.     Objective:   Physical Exam  Constitutional: He is oriented to person, place, and time. He appears well-developed and well-nourished.  HENT:  Head:  Normocephalic and atraumatic.  Right Ear: External ear normal.  Left Ear: External ear normal.  Mouth/Throat: Oropharynx is clear and moist.  Eyes: Pupils are equal, round, and reactive to light. Conjunctivae and EOM are normal.  Neck: Normal range of motion. Neck supple. No thyromegaly present.  Cardiovascular: Normal rate, regular rhythm, normal heart sounds and intact distal pulses.   Pulmonary/Chest: Effort normal and breath sounds normal. No respiratory distress. He has no wheezes.  Abdominal: Soft. He exhibits no distension. There is no tenderness. Hernia confirmed negative in the right inguinal area and confirmed negative in the left inguinal area.  Genitourinary:  Genitourinary Comments: Slightly larger L versus R testicle but no masses palpated. No hernia.  Musculoskeletal: Normal range of motion. He exhibits no edema or tenderness.  Lymphadenopathy:    He has no cervical adenopathy.  Neurological: He is alert and oriented to person, place, and time. He has normal reflexes.  Skin: Skin is warm and dry.  Psychiatric: He has a normal mood and affect. His behavior is normal.  Vitals reviewed. L hip exam deferred.   Vitals:   12/13/16 0831  BP: 116/81  Pulse: 69  Resp: 16  Temp: 98.8 F (37.1 C)  TempSrc: Oral  SpO2: 98%  Weight: 218 lb (98.9 kg)  Height: 6\' 1"  (1.854 m)   EKG reading done by Wendie Agreste, MD: T wave inversion lead 3. Compared to flat T wave on EKG in 2014. No apparent changes otherwise.     Assessment & Plan:   JASIR ROTHER is a 31 y.o. male Annual physical exam  - -anticipatory guidance as below in AVS, screening labs above. Health maintenance items as above in HPI discussed/recommended as applicable.   Paroxysmal atrial fibrillation (HCC) - Plan: EKG 12-Lead, TSH  - Rare symptoms. Check TSH, questionable T-wave inversion in lead 3 on EKG, but no other apparent acute findings. Recommended calling cardiology for follow-up.  Upcoming surgery  planned without apparent request for clearance. If this was requested, would ask cardiology to also weigh in.  Hypogonadism male - Plan: Testosterone, Free, Total, SHBG, PSA, testosterone (ANDROGEL) 50 MG/5GM (1%) GEL  - refilled androgel, but check baseline levels, PSA, lipids, CBC for monitoring.   Screening for malignant neoplasm of prostate  - check PSA  Screening, anemia, deficiency, iron - Plan: CBC  Screening for thyroid disorder - Plan: TSH  - hx of Afib. Check tsh.   Screening for diabetes mellitus - Plan: Comprehensive metabolic panel  Screening for hyperlipidemia - Plan: Lipid panel  Erectile dysfunction, unspecified erectile dysfunction type - Plan: sildenafil (VIAGRA) 100 MG tablet  -viagra Rx given - use lowest effective dose. Side effects discussed (including but  not limited to headache/flushing, blue discoloration of vision, possible vascular steal and risk of cardiac effects if underlying unknown coronary artery disease, and permanent sensorineural hearing loss). Understanding expressed.   Meds ordered this encounter  Medications  . testosterone (ANDROGEL) 50 MG/5GM (1%) GEL    Sig: Place 5 g onto the skin daily.    Dispense:  30 Tube    Refill:  2  . sildenafil (VIAGRA) 100 MG tablet    Sig: Take 0.5-1 tablets (50-100 mg total) by mouth daily as needed for erectile dysfunction.    Dispense:  5 tablet    Refill:  11   Patient Instructions    Avoid stimulants like caffeine as much as possible and call cardiology for follow-up of atrial fibrillation. I will check a thyroid test and other blood work today.  I will restart the same dose of testosterone, but will need to recheck those levels in the next few months. Please follow-up prior to that medication running out.   Good luck on the upcoming surgery.   Keeping you healthy  Get these tests  Blood pressure- Have your blood pressure checked once a year by your healthcare provider.  Normal blood pressure is  120/80.  Weight- Have your body mass index (BMI) calculated to screen for obesity.  BMI is a measure of body fat based on height and weight. You can also calculate your own BMI at [employer]velBags.it.  Cholesterol- Have your cholesterol checked regularly starting at age 41, sooner may be necessary if you have diabetes, high blood pressure, if a family member developed heart diseases at an early age or if you smoke.   Chlamydia, HIV, and other sexual transmitted disease- Get screened each year until the age of 57 then within three months of each new sexual partner.  Diabetes- Have your blood sugar checked regularly if you have high blood pressure, high cholesterol, a family history of diabetes or if you are overweight.  Get these vaccines  Flu shot- Every fall.  Tetanus shot- Every 10 years.  Menactra- Single dose; prevents meningitis.  Take these steps  Don't smoke- If you do smoke, ask your healthcare provider about quitting. For tips on how to quit, go to www.smokefree.gov or call 1-800-QUIT-NOW.  Be physically active- Exercise 5 days a week for at least 30 minutes.  If you are not already physically active start slow and gradually work up to 30 minutes of moderate physical activity.  Examples of moderate activity include walking briskly, mowing the yard, dancing, swimming bicycling, etc.  Eat a healthy diet- Eat a variety of healthy foods such as fruits, vegetables, low fat milk, low fat cheese, yogurt, lean meats, poultry, fish, beans, tofu, etc.  For more information on healthy eating, go to www.thenutritionsource.org  Drink alcohol in moderation- Limit alcohol intake two drinks or less a day.  Never drink and drive.  Dentist- Brush and floss teeth twice daily; visit your dentis twice a year.  Depression-Your emotional health is as important as your physical health.  If you're feeling down, losing interest in things you normally enjoy please talk with your healthcare  provider.  Gun Safety- If you keep a gun in your home, keep it unloaded and with the safety lock on.  Bullets should be stored separately.  Helmet use- Always wear a helmet when riding a motorcycle, bicycle, rollerblading or skateboarding.  Safe sex- If you may be exposed to a sexually transmitted infection, use a condom  Seat belts- Seat bels can save  your life; always wear one.  Smoke/Carbon Monoxide detectors- These detectors need to be installed on the appropriate level of your home.  Replace batteries at least once a year.  Skin Cancer- When out in the sun, cover up and use sunscreen SPF 15 or higher.  Violence- If anyone is threatening or hurting you, please tell your healthcare provider.    IF you received an x-ray today, you will receive an invoice from [facility]. Please contact [facility] at [phone] with questions or concerns regarding your invoice.   IF you received labwork today, you will receive an invoice from [facility]. Please contact [facility] at [phone] with questions or concerns regarding your invoice.   Our billing staff will not be able to assist you with questions regarding bills from these companies.  You will be contacted with the lab results as soon as they are available. The fastest way to get your results is to activate your My Chart account. Instructions are located on the last page of this paperwork. If you have not heard from [place] regarding the results in 2 weeks, please contact this office.       I personally performed the services described in this documentation, which was scribed in my presence. The recorded information has been reviewed and considered for accuracy and completeness, addended by me as needed, and agree with information above.  Signed,   [name], MD Primary [facility] at [facility].  12/13/16 9:42 AM

## 2016-12-13 NOTE — Telephone Encounter (Signed)
Cardiology referral placed. Thanks.

## 2016-12-13 NOTE — Patient Instructions (Addendum)
Avoid stimulants like caffeine as much as possible and call cardiology for follow-up of atrial fibrillation. I will check a thyroid test and other blood work today.  I will restart the same dose of testosterone, but will need to recheck those levels in the next few months. Please follow-up prior to that medication running out.   Good luck on the upcoming surgery.   Keeping you healthy  Get these tests  Blood pressure- Have your blood pressure checked once a year by your healthcare provider.  Normal blood pressure is 120/80.  Weight- Have your body mass index (BMI) calculated to screen for obesity.  BMI is a measure of body fat based on height and weight. You can also calculate your own BMI at [employer]Bags.it.  Cholesterol- Have your cholesterol checked regularly starting at age 72, sooner may be necessary if you have diabetes, high blood pressure, if a family member developed heart diseases at an early age or if you smoke.   Chlamydia, HIV, and other sexual transmitted disease- Get screened each year until the age of 45 then within three months of each new sexual partner.  Diabetes- Have your blood sugar checked regularly if you have high blood pressure, high cholesterol, a family history of diabetes or if you are overweight.  Get these vaccines  Flu shot- Every fall.  Tetanus shot- Every 10 years.  Menactra- Single dose; prevents meningitis.  Take these steps  Don't smoke- If you do smoke, ask your healthcare provider about quitting. For tips on how to quit, go to www.smokefree.gov or call 1-800-QUIT-NOW.  Be physically active- Exercise 5 days a week for at least 30 minutes.  If you are not already physically active start slow and gradually work up to 30 minutes of moderate physical activity.  Examples of moderate activity include walking briskly, mowing the yard, dancing, swimming bicycling, etc.  Eat a healthy diet- Eat a variety of healthy foods such as fruits,  vegetables, low fat milk, low fat cheese, yogurt, lean meats, poultry, fish, beans, tofu, etc.  For more information on healthy eating, go to www.thenutritionsource.org  Drink alcohol in moderation- Limit alcohol intake two drinks or less a day.  Never drink and drive.  Dentist- Brush and floss teeth twice daily; visit your dentis twice a year.  Depression-Your emotional health is as important as your physical health.  If you're feeling down, losing interest in things you normally enjoy please talk with your healthcare provider.  Gun Safety- If you keep a gun in your home, keep it unloaded and with the safety lock on.  Bullets should be stored separately.  Helmet use- Always wear a helmet when riding a motorcycle, bicycle, rollerblading or skateboarding.  Safe sex- If you may be exposed to a sexually transmitted infection, use a condom  Seat belts- Seat bels can save your life; always wear one.  Smoke/Carbon Monoxide detectors- These detectors need to be installed on the appropriate level of your home.  Replace batteries at least once a year.  Skin Cancer- When out in the sun, cover up and use sunscreen SPF 15 or higher.  Violence- If anyone is threatening or hurting you, please tell your healthcare provider.    IF you received an x-ray today, you will receive an invoice from [facility]. Please contact [facility] at [phone] with questions or concerns regarding your invoice.   IF you received labwork today, you will receive an invoice from [facility]. Please contact [facility] at [phone] with questions or concerns regarding  your invoice.   Our billing staff will not be able to assist you with questions regarding bills from these companies.  You will be contacted with the lab results as soon as they are available. The fastest way to get your results is to activate your My Chart account. Instructions are located on the last page of this paperwork. If you have not  heard from [place] regarding the results in 2 weeks, please contact this office.

## 2016-12-14 LAB — COMPREHENSIVE METABOLIC PANEL
A/G RATIO: 1.8 (ref 1.2–2.2)
ALBUMIN: 4.8 g/dL (ref 3.5–5.5)
ALT: 46 IU/L — ABNORMAL HIGH (ref 0–44)
AST: 26 IU/L (ref 0–40)
Alkaline Phosphatase: 51 IU/L (ref 39–117)
BUN / CREAT RATIO: 15 (ref 9–20)
BUN: 17 mg/dL (ref 6–20)
Bilirubin Total: 0.8 mg/dL (ref 0.0–1.2)
CALCIUM: 9.7 mg/dL (ref 8.7–10.2)
CO2: 22 mmol/L (ref 20–29)
CREATININE: 1.1 mg/dL (ref 0.76–1.27)
Chloride: 101 mmol/L (ref 96–106)
GFR, EST AFRICAN AMERICAN: 102 mL/min/{1.73_m2} (ref >59)
GFR, EST NON AFRICAN AMERICAN: 88 mL/min/{1.73_m2} (ref >59)
GLOBULIN, TOTAL: 2.7 g/dL (ref 1.5–4.5)
Glucose: 96 mg/dL (ref 65–99)
Potassium: 4.6 mmol/L (ref 3.5–5.2)
SODIUM: 135 mmol/L (ref 134–144)
Total Protein: 7.5 g/dL (ref 6.0–8.5)

## 2016-12-14 LAB — LIPID PANEL
CHOL/HDL RATIO: 3.2 ratio (ref 0.0–5.0)
Cholesterol, Total: 182 mg/dL (ref 100–199)
HDL: 57 mg/dL (ref >39)
LDL CALC: 107 mg/dL — AB (ref 0–99)
Triglycerides: 88 mg/dL (ref 0–149)
VLDL Cholesterol Cal: 18 mg/dL (ref 5–40)

## 2016-12-14 LAB — PSA: PROSTATE SPECIFIC AG, SERUM: 0.6 ng/mL (ref 0.0–4.0)

## 2016-12-14 LAB — CBC
HEMATOCRIT: 45.5 % (ref 37.5–51.0)
Hemoglobin: 15.7 g/dL (ref 13.0–17.7)
MCH: 30.8 pg (ref 26.6–33.0)
MCHC: 34.5 g/dL (ref 31.5–35.7)
MCV: 89 fL (ref 79–97)
PLATELETS: 298 10*3/uL (ref 150–379)
RBC: 5.1 x10E6/uL (ref 4.14–5.80)
RDW: 13.8 % (ref 12.3–15.4)
WBC: 6.6 10*3/uL (ref 3.4–10.8)

## 2016-12-14 LAB — TESTOSTERONE, FREE, TOTAL, SHBG
Sex Hormone Binding: 14.8 nmol/L — ABNORMAL LOW (ref 16.5–55.9)
Testosterone, Free: 9.4 pg/mL (ref 8.7–25.1)
Testosterone: 178 ng/dL — ABNORMAL LOW (ref 264–916)

## 2016-12-14 LAB — TSH: TSH: 1.93 u[IU]/mL (ref 0.450–4.500)

## 2016-12-20 ENCOUNTER — Encounter: Payer: Self-pay | Admitting: Family Medicine

## 2016-12-20 ENCOUNTER — Encounter: Payer: Self-pay | Admitting: Cardiovascular Disease

## 2016-12-21 ENCOUNTER — Encounter: Payer: Self-pay | Admitting: Cardiovascular Disease

## 2016-12-21 ENCOUNTER — Telehealth: Payer: Self-pay

## 2016-12-21 ENCOUNTER — Ambulatory Visit (INDEPENDENT_AMBULATORY_CARE_PROVIDER_SITE_OTHER): Payer: Managed Care, Other (non HMO) | Admitting: Cardiovascular Disease

## 2016-12-21 VITALS — BP 128/82 | HR 61 | Ht 73.0 in | Wt 221.0 lb

## 2016-12-21 DIAGNOSIS — R9431 Abnormal electrocardiogram [ECG] [EKG]: Secondary | ICD-10-CM | POA: Diagnosis not present

## 2016-12-21 DIAGNOSIS — Z01810 Encounter for preprocedural cardiovascular examination: Secondary | ICD-10-CM | POA: Diagnosis not present

## 2016-12-21 DIAGNOSIS — I480 Paroxysmal atrial fibrillation: Secondary | ICD-10-CM | POA: Diagnosis not present

## 2016-12-21 NOTE — Telephone Encounter (Signed)
Testosterone PA started  Awaiting Response

## 2016-12-21 NOTE — Patient Instructions (Signed)
Medication Instructions:  Your physician recommends that you continue on your current medications as directed. Please refer to the Current Medication list given to you today.  Follow-Up: Your physician wants you to follow-up in: 1 YEAR with [name]. You will receive a reminder letter in the mail two months in advance. If you don't receive a letter, please call our office to schedule the follow-up appointment.   Any Other Special Instructions Will Be Listed Below (If Applicable).     If you need a refill on your cardiac medications before your next appointment, please call your pharmacy.

## 2016-12-21 NOTE — Progress Notes (Signed)
Cardiology Office Note:    Date:  12/21/2016   ID:  [name], DOB November 15, 1985, MRN [id number]  PCP:  Wendie Agreste, MD  Cardiologist:  Sanda Klein, MD    Referring MD: Wendie Agreste, MD   Chief complaint: Atrial fibrillation follow-up  History of Present Illness:    [name] is a 31 y.o. male with a hx of infrequent episodes of paroxysmal atrial fibrillation without evidence of significant structural heart disease. This is his first Cardiology appointment since 2014.  [name] is planning hip surgery. He has a torn labrum following a sports injury about a year ago. It has not improved with rest and physical therapy. The surgery will be performed at [facility] in August.  His episodes of palpitations are infrequent. They happen at the most once a quarter and last for only a few seconds. He feels this heart beating irregularly and feels very briefly lightheaded. He denies syncope, dyspnea, angina, leg edema, intermittent claudication or any focal neurological events. He is able to perform all his yard work despite the pain related to his surgery, about developing dyspnea or angina.  Remote echocardiography showed a very mildly dilated left atrium at 42 mm, but no other structural abnormalities (although mitral valve prolapse is listed in his history, the echo only showed systolic bowing of the anterior mitral leaflet without frank prolapse and with only trace regurgitation)  Past Medical History:  Diagnosis Date  . A-fib ([place])   . Heart murmur   . Mitral valve prolapse   . Paroxysmal atrial fibrillation [facility])     Past Surgical History:  Procedure Laterality Date  . NM MYOCAR PERF WALL MOTION  12/11/2004   [place], MD: no ischemia  . SURGERY SCROTAL / TESTICULAR  06/1994   torsion  . US ECHOCARDIOGRAPHY  10/06/2010   LA mildly dilated,trace MR  . WRIST SURGERY  05/2004   tendon repair    Current Medications: Current Meds  Medication Sig  . sildenafil  (VIAGRA) 100 MG tablet Take 0.5-1 tablets (50-100 mg total) by mouth daily as needed for erectile dysfunction.  Marland Kitchen testosterone (ANDROGEL) 50 MG/5GM (1%) GEL Place 5 g onto the skin daily.     Allergies:   Patient has no known allergies.   Social History   Social History  . Marital status: Single    Spouse name: N/A  . Number of children: N/A  . Years of education: N/A   Social History Main Topics  . Smoking status: Former Smoker    Types: Cigars  . Smokeless tobacco: Never Used  . Alcohol use Yes     Comment: social  . Drug use: No  . Sexual activity: Yes    Birth control/ protection: None   Other Topics Concern  . None   Social History Narrative  . None     Family History: The patient's family history includes Cancer in his maternal grandmother, mother, paternal grandfather, and paternal grandmother; Diabetes in his father; Hyperlipidemia in his father; Hypertension in his father. ROS:   Please see the history of present illness.     All other systems reviewed and are negative.  EKGs/Labs/Other Studies Reviewed:    The following studies were reviewed today: ECG from Dr. Vonna Kotyk office  EKG:  EKG is  ordered today.  The ekg ordered today demonstrates normal sinus rhythm, very minor nonspecific intraventricular conduction delay, flat T waves V4-V6. Mild QRS prolongation and subtle T-wave changes have been seen off and on on  echocardiogram performed over the last several years.  Recent Labs: 12/13/2016: ALT 46; BUN 17; Creatinine, Ser 1.10; Hemoglobin 15.7; Platelets 298; Potassium 4.6; Sodium 135; TSH 1.930  Recent Lipid Panel    Component Value Date/Time   CHOL 182 12/13/2016 0918   TRIG 88 12/13/2016 0918   HDL 57 12/13/2016 0918   CHOLHDL 3.2 12/13/2016 0918   CHOLHDL 3.0 09/30/2014 1531   VLDL 20 09/30/2014 1531   LDLCALC 107 (H) 12/13/2016 0918    Physical Exam:    VS:  BP 128/82   Pulse 61   Ht 6\' 1"  (1.854 m)   Wt 221 lb (100.2 kg)   BMI 29.16  kg/m     Wt Readings from Last 3 Encounters:  12/21/16 221 lb (100.2 kg)  12/13/16 218 lb (98.9 kg)  07/08/16 223 lb (101.2 kg)     GEN:  Well nourished, well developed in no acute distress HEENT: Normal NECK: No JVD; No carotid bruits LYMPHATICS: No lymphadenopathy CARDIAC: RRR, no murmurs, rubs, gallops RESPIRATORY:  Clear to auscultation without rales, wheezing or rhonchi  ABDOMEN: Soft, non-tender, non-distended MUSCULOSKELETAL:  No edema; No deformity  SKIN: Warm and dry NEUROLOGIC:  Alert and oriented x 3 PSYCHIATRIC:  Normal affect   ASSESSMENT:    1. Paroxysmal atrial fibrillation (HCC)   2. Abnormal electrocardiogram   3. Preoperative cardiovascular examination    PLAN:    In order of problems listed above:  1. AFib: Episodes have been very brief and very infrequent. While its possible that he may have paroxysmal atrial fibrillation due to the adrenergic stimulation around the time of hip surgery, I'm sure that this will be short-lived and relatively easy to manage. I don't think it's worth putting him on prophylactic antiarrhythmics or beta blockers. Continue aspirin only for stroke prophylaxis due to low embolic risk. CHADSVasc zero. Will be restarting aspirin after surgery. 2. Abnormal ECG: Suspect subtle repolarization changes are related to the minor intraventricular conduction abnormalities are seen intermittently. He had a normal treadmill stress test in the past and does not have symptoms of coronary disease. I would not submit him to a treadmill stress test with his current hip problems. 3. Preop exam: Low risk for major cardiac complications with planned orthopedic procedure   Medication Adjustments/Labs and Tests Ordered: Current medicines are reviewed at length with the patient today.  Concerns regarding medicines are outlined above.  Orders Placed This Encounter  Procedures  . EKG 12-Lead   No orders of the defined types were placed in this  encounter.   Signed, Sanda Klein, MD  12/21/2016 10:38 AM    [facility]

## 2016-12-24 NOTE — Telephone Encounter (Signed)
Checked on status.  Insurance still working on [employer].  Will recheck on Monday.

## 2016-12-27 NOTE — Telephone Encounter (Signed)
[employer] company again to seek status of claim. Representative stated that "packets" are not approved under patient's plan.  Dr. Vonna Kotyk note specifies tube, therefore, I changed packages to tube per chart.  After that change, the PA was approved.  PA# is [id number] and is good from 11/27/16 until 12/27/17.   Rep stated that they would fax [place] a confirmation letter, and mail one to the patient.

## 2017-01-05 NOTE — Telephone Encounter (Signed)
Pharmacy sent another PA for 50mg  testosterone.  After reviewing Dr. Vonna Kotyk notes, I verified that RX was for 50 mg packets.  I called [facility] to verify.  Then, I called [employer] at [phone] and received PA for the 50 mg.  Approved from 12/06/2016-01/05/2018.  Case # is [id number].  LMVM advising patient.

## 2017-01-28 ENCOUNTER — Ambulatory Visit: Payer: Self-pay | Admitting: Cardiovascular Disease

## 2017-03-19 ENCOUNTER — Ambulatory Visit (INDEPENDENT_AMBULATORY_CARE_PROVIDER_SITE_OTHER): Payer: Managed Care, Other (non HMO)

## 2017-03-19 ENCOUNTER — Encounter: Payer: Self-pay | Admitting: Osteopathic Medicine

## 2017-03-19 ENCOUNTER — Ambulatory Visit (INDEPENDENT_AMBULATORY_CARE_PROVIDER_SITE_OTHER): Payer: Managed Care, Other (non HMO) | Admitting: Osteopathic Medicine

## 2017-03-19 VITALS — BP 130/90 | HR 90 | Temp 98.8°F | Resp 16 | Ht 73.62 in | Wt 223.0 lb

## 2017-03-19 DIAGNOSIS — L290 Pruritus ani: Secondary | ICD-10-CM

## 2017-03-19 DIAGNOSIS — R350 Frequency of micturition: Secondary | ICD-10-CM | POA: Diagnosis not present

## 2017-03-19 DIAGNOSIS — K625 Hemorrhage of anus and rectum: Secondary | ICD-10-CM

## 2017-03-19 DIAGNOSIS — R110 Nausea: Secondary | ICD-10-CM

## 2017-03-19 DIAGNOSIS — R1032 Left lower quadrant pain: Secondary | ICD-10-CM

## 2017-03-19 DIAGNOSIS — R197 Diarrhea, unspecified: Secondary | ICD-10-CM | POA: Diagnosis not present

## 2017-03-19 LAB — POCT URINALYSIS DIPSTICK
Bilirubin, UA: NEGATIVE
Blood, UA: NEGATIVE
Glucose, UA: NEGATIVE
KETONES UA: NEGATIVE
Nitrite, UA: NEGATIVE
PH UA: 6.5 (ref 5.0–8.0)
PROTEIN UA: NEGATIVE
SPEC GRAV UA: 1.02 (ref 1.010–1.025)
UROBILINOGEN UA: 0.2 U/dL

## 2017-03-19 LAB — HEMOCCULT GUIAC POC 1CARD (OFFICE): Fecal Occult Blood, POC: NEGATIVE

## 2017-03-19 MED ORDER — HYDROCORTISONE ACETATE 25 MG RE SUPP: 25.0000 mg | Freq: Two times a day (BID) | RECTAL | 1 refills | Status: DC | PRN

## 2017-03-19 MED ORDER — METRONIDAZOLE 500 MG PO TABS: 500.0000 mg | ORAL_TABLET | Freq: Three times a day (TID) | ORAL | 0 refills | Status: DC

## 2017-03-19 MED ORDER — ONDANSETRON 8 MG PO TBDP: 8.0000 mg | ORAL_TABLET | Freq: Three times a day (TID) | ORAL | 1 refills | Status: DC | PRN

## 2017-03-19 MED ORDER — CIPROFLOXACIN HCL 500 MG PO TABS: 500.0000 mg | ORAL_TABLET | Freq: Two times a day (BID) | ORAL | 0 refills | Status: DC

## 2017-03-19 NOTE — Patient Instructions (Addendum)
Plan:   Most viral infections or food poisoning that cause diarrhea go away in 7-10 days. Given persistent symptoms, we should think about other causes such as colitis or C. Diff infection or other.   We are getting some labs to evaluate, plus stool culture to rule out C. Diff or other unusual bacterial causes. I have sent medications for nausea and diarrhea, and I think it might be prudent to go ahead and start antibiotics for possible colitis.   If you get worse, especially if severe pain and/or fever, please seek emergency care as you may need a CT scan to conform the diagnosis. If labs are significantly abnormal, we may also ask you to get a CT scan. If you're getting better with treatment as above, no need to do anything else   See below for diet recommendations: bowel rest with liquid progressing to bland diet may help symptoms as well      IF you received an x-ray today, you will receive an invoice from [facility] Radiology. Please contact [facility] Radiology at [phone] with questions or concerns regarding your invoice.   IF you received labwork today, you will receive an invoice from [facility]. Please contact [facility] at [phone] with questions or concerns regarding your invoice.   Our billing staff will not be able to assist you with questions regarding bills from these companies.  You will be contacted with the lab results as soon as they are available. The fastest way to get your results is to activate your My Chart account. Instructions are located on the last page of this paperwork. If you have not heard from [place] regarding the results in 2 weeks, please contact this office.        Clear Liquid Diet, Adult A clear liquid diet is a diet that includes only liquids that you can see through. You may need to follow a clear liquid diet if:  You develop a medical condition right before or after you have surgery.  You were not able to eat food for a long period of  time.  You had a condition that gave you diarrhea.  You are going to have an exam, such as a colonoscopy, in which instruments will be put into your body to look at parts of your digestive system.  You are going to have bowel surgery.  The usual goals of this diet are:  To rest the stomach and digestive system as much as possible.  To keep you hydrated.  To make sure you get some calories for energy.  To help you return to normal digestion.  Most people need to follow this diet for only a short period of time. What do I need to know about this diet?  A clear liquid is a liquid that you can see through when you hold it up to a light.  A clear liquid diet does not provide all the nutrients that you need. It is important to choose a variety of the liquids that are allowed on this diet. That way, you will get as many nutrients as possible.  If you are not sure whether you can have certain items, ask your health care provider. What can I have?  Water and flavored water.  Fruit juices that do not have pulp, such as cranberry juice and apple juice.  Tea and coffee without milk or cream.  Clear bouillon or broth.  Broth-based soups that have been strained.  Flavored gelatins.  Honey.  Sugar water.  Frozen ice  or frozen ice pops that do not contain milk, yogurt, fruit pieces, or fruit pulp.  Clear sodas.  Clear sports drinks. The items listed above may not be a complete list of recommended liquids. Contact your dietitian for more options. What can I not have?  Juices that have pulp.  Milk.  Cream or cream-based soups.  Yogurt. The items listed above may not be a complete list of liquids to avoid. Contact your dietitian for more information. Summary  A clear liquid diet is a diet that includes only liquids that you can see through.  The goal of this diet is to help you recover by resting your digestive system, keeping you hydrated, and providing  nutrients.  Make sure to avoid liquids with milk, cream, or pulp while on this diet. This information is not intended to replace advice given to you by your health care provider. Make sure you discuss any questions you have with your health care provider. Document Released: 05/10/2005 Document Revised: 12/23/2015 Document Reviewed: 04/06/2013 Elsevier Interactive Patient Education  2018 [place].   Full Liquid Diet A full liquid diet may be used:  To help you transition from a clear liquid diet to a soft diet.  When your body is healing and can only tolerate foods that are easy to digest.  Before or after certain a procedure, test, or surgery (such as stomach or intestinal surgeries).  If you have trouble swallowing or chewing.  A full liquid diet includes fluids and foods that are liquid or will become liquid at room temperature. The full liquid diet gives you the proteins, fluids, salts, and minerals that you need for energy. If you continue this diet for more than 72 hours, talk to your health care provider about how many calories you need to consume. If you continue the diet for more than 5 days, talk to your health care provider about taking a multivitamin or a nutritional supplement. What do I need to know about a full liquid diet?  You may have any liquid.  You may have any food that becomes a liquid at room temperature. The food is considered a liquid if it can be poured off a spoon at room temperature.  Drink one serving of citrus or vitamin C-enriched fruit juice daily. What foods can I eat? Grains Any grain food that can be pureed in soup (such as crackers, pasta, and rice). Hot cereal (such as farina or oatmeal) that has been blended. Talk to your health care provider or dietitian about these foods. Vegetables Pulp-free tomato or vegetable juice. Vegetables pureed in soup. Fruits Fruit juice, including nectars and juices with pulp. Meats and Other Protein  Sources Eggs in custard, eggnog mix, and eggs used in ice cream or pudding. Strained meats, like in baby food, may be allowed. Consult your health care provider. Dairy Milk and milk-based beverages, including milk shakes and instant breakfast mixes. Smooth yogurt. Pureed cottage cheese. Avoid these foods if they are not well tolerated. Beverages All beverages, including liquid nutritional supplements. Ask your health care provider if you can have carbonated beverages. They may not be well tolerated. Condiments Iodized salt, pepper, spices, and flavorings. Cocoa powder. Vinegar, ketchup, yellow mustard, smooth sauces (such as hollandaise, cheese sauce, or white sauce), and soy sauce. Sweets and Desserts Custard, smooth pudding. Flavored gelatin. Tapioca, junket. Plain ice cream, sherbet, fruit ices. Frozen ice pops, frozen fudge pops, pudding pops, and other frozen bars with cream. Syrups, including chocolate syrup. Sugar, honey, jelly. Fats  and Oils Margarine, butter, cream, sour cream, and oils. Other Broth and cream soups. Strained, broth-based soups. The items listed above may not be a complete list of recommended foods or beverages. Contact your dietitian for more options. What foods can I not eat? Grains All breads. Grains are not allowed unless they are pureed into soup. Vegetables Vegetables are not allowed unless they are juiced, or cooked and pureed into soup. Fruits Fruits are not allowed unless they are juiced. Meats and Other Protein Sources Any meat or fish. Cooked or raw eggs. Nut butters. Dairy Cheese. Condiments Stone ground mustards. Fats and Oils Fats that are coarse or chunky. Sweets and Desserts Ice cream or other frozen desserts that have any solids in them or on top, such as nuts, chocolate chips, and pieces of cookies. Cakes. Cookies. Candy. Others Soups with chunks or pieces in them. The items listed above may not be a complete list of foods and beverages to  avoid. Contact your dietitian for more information. This information is not intended to replace advice given to you by your health care provider. Make sure you discuss any questions you have with your health care provider. Document Released: 05/10/2005 Document Revised: 10/16/2015 Document Reviewed: 03/15/2013 Elsevier Interactive Patient Education  2017 [place] Diet A bland diet consists of foods that do not have a lot of fat or fiber. Foods without fat or fiber are easier for the body to digest. They are also less likely to irritate your mouth, throat, stomach, and other parts of your gastrointestinal tract. A bland diet is sometimes called a BRAT diet. What is my plan? Your health care provider or dietitian may recommend specific changes to your diet to prevent and treat your symptoms, such as:  Eating small meals often.  Cooking food until it is soft enough to chew easily.  Chewing your food well.  Drinking fluids slowly.  Not eating foods that are very spicy, sour, or fatty.  Not eating citrus fruits, such as oranges and grapefruit.  What do I need to know about this diet?  Eat a variety of foods from the bland diet food list.  Do not follow a bland diet longer than you have to.  Ask your health care provider whether you should take vitamins. What foods can I eat? Grains  Hot cereals, such as cream of wheat. Bread, crackers, or tortillas made from refined white flour. Rice. Vegetables Canned or cooked vegetables. Mashed or boiled potatoes. Fruits Bananas. Applesauce. Other types of cooked or canned fruit with the skin and seeds removed, such as canned peaches or pears. Meats and Other Protein Sources Scrambled eggs. Creamy peanut butter or other nut butters. Lean, well-cooked meats, such as chicken or fish. Tofu. Soups or broths. Dairy Low-fat dairy products, such as milk, cottage cheese, or yogurt. Beverages Water. Herbal tea. Apple juice. Sweets and  Desserts Pudding. Custard. Fruit gelatin. Ice cream. Fats and Oils Mild salad dressings. Canola or olive oil. The items listed above may not be a complete list of allowed foods or beverages. Contact your dietitian for more options. What foods are not recommended? Foods and ingredients that are often not recommended include:  Spicy foods, such as hot sauce or salsa.  Fried foods.  Sour foods, such as pickled or fermented foods.  Raw vegetables or fruits, especially citrus or berries.  Caffeinated drinks.  Alcohol.  Strongly flavored seasonings or condiments.  The items listed above may not be a complete list of  foods and beverages that are not allowed. Contact your dietitian for more information. This information is not intended to replace advice given to you by your health care provider. Make sure you discuss any questions you have with your health care provider. Document Released: 09/01/2015 Document Revised: 10/16/2015 Document Reviewed: 05/22/2014 Elsevier Interactive Patient Education  2018 [employer].

## 2017-03-19 NOTE — Progress Notes (Signed)
HPI: [name] is a 31 y.o. male with has a past medical history of A-fib ([place]); Heart murmur; Mitral valve prolapse; and Paroxysmal atrial fibrillation ([place]).  who presents to Primary Care at [facility] today, 03/19/17,  for chief complaint of:  Chief Complaint  Patient presents with  . Abdominal Pain    x 1 week with some Nausea and Fatigue   . Nausea  . Diarrhea    with some spotting blood  when wipe and itching in the rectal area      LLQ pain x2 weeks getting a bit worse, nothing makes it better, no patern with food, no relief with BM, no hx colonoscopy or CT scan, (+)diverticular disease in twin brother and in dad.   Loose stool few times per day. (+)exposure to healthcare settings for recent surgery 2 months ago, and going to follow up visits and rehab/PT.   No bloody stool. but (+)blood with wiping and (+)rectal itching. Hx paroxysmal Afib but on no ASA or anticoagulation.     Past medical, surgical, social and family history reviewed:  Patient Active Problem List   Diagnosis Date Noted  . Paroxysmal atrial fibrillation ([place]) 12/27/2012    Past Surgical History:  Procedure Laterality Date  . HIP SURGERY    . NM MYOCAR PERF WALL MOTION  12/11/2004   [place], [place]: no ischemia  . SURGERY SCROTAL / TESTICULAR  06/1994   torsion  . US ECHOCARDIOGRAPHY  10/06/2010   [place] mildly dilated,trace MR  . WRIST SURGERY  05/2004   tendon repair    Social History  Substance Use Topics  . Smoking status: Former Smoker    Types: Cigars  . Smokeless tobacco: Never Used  . Alcohol use Yes     Comment: social    Family History  Problem Relation Age of Onset  . Cancer Mother   . Diabetes Father   . Hypertension Father   . Hyperlipidemia Father   . Cancer Maternal Grandmother   . Cancer Paternal Grandmother   . Cancer Paternal Grandfather      Current medication list and allergy/intolerance information reviewed:    Current Outpatient Prescriptions  Medication Sig  Dispense Refill  . sildenafil (VIAGRA) 100 MG tablet Take 0.5-1 tablets (50-100 mg total) by mouth daily as needed for erectile dysfunction. 5 tablet 11  . testosterone (ANDROGEL) 50 MG/5GM (1%) GEL Place 5 g onto the skin daily. 30 Tube 2   No current facility-administered medications for this visit.     No Known Allergies    Review of Systems:  Constitutional:  No  fever, no chills, +recent illness as per HPI, No unintentional weight changes. +significant fatigue.   HEENT: No  headache, no vision change  Cardiac: No  chest pain, No  pressure, No palpitations,  Resptory:  No  shortness of breath. No  Cough  Gastrointestinal: +abdominal pain, +nausea, No  vomiting,  No  blood in stool, +diarrhea  Musculoskeletal: No new myalgia/arthralgia  Genitourinary: No  incontinence, No  abnormal genital bleeding, No abnormal genital discharge, +mild urinary frequency  Skin: No  Rash  Neurologic: No  weakness, No  dizziness   Exam:  BP 130/90   Pulse 90   Temp 98.8 F (37.1 C) (Oral)   Resp 16   Ht 6' 1.62" (1.87 m)   Wt 223 lb (101.2 kg)   SpO2 98%   BMI 28.93 kg/m   Constitutional: VS see above. General Appearance: alert, well-developed, well-nourished, NAD  Eyes: Normal  lids and conjunctive, non-icteric sclera  Ears, Nose, Mouth, Throat: MMM, Normal external inspection ears/nares/mouth/lips/gums. TM normal bilaterally. Pharynx/tonsils no erythema, no exudate. Nasal mucosa normal.   Neck: No masses, trachea midline. No thyroid enlargement.   Respiratory: Normal respiratory effort. no wheeze, no rhonchi, no rales  Cardiovascular: S1/S2 normal, no murmur, no rub/gallop auscultated. RRR. No lower extremity edema.   Gastrointestinal: (+)TTP LLQ, no masses. No hepatomegaly, no splenomegaly. No hernia appreciated. Bowel sounds normal. Rectal exam normal tone, no blood pr obvious hemorrhoid.   Musculoskeletal: Gait normal.  Neurological: Normal balance/coordination. No  tremor.   Skin: warm, dry, intact. No rash/ulcer.       Results for orders placed or performed in visit on 03/19/17 (from the past 72 hour(s))  POCT Urinalysis Dipstick     Status: Abnormal   Collection Time: 03/19/17 11:29 AM  Result Value Ref Range   Color, UA yellow    Clarity, UA clear    Glucose, UA negative    Bilirubin, UA negative    Ketones, UA negative    Spec Grav, UA 1.020 1.010 - 1.025   Blood, UA negative    pH, UA 6.5 5.0 - 8.0   Protein, UA negative    Urobilinogen, UA 0.2 0.2 or 1.0 E.U./dL   Nitrite, UA negative    Leukocytes, UA Trace (A) Negative  Hemoccult - 1 Card (office)     Status: Normal   Collection Time: 03/19/17 11:50 AM  Result Value Ref Range   Fecal Occult Blood, POC Negative Negative   Card #1 Date 03/19/2017    Card #2 Fecal Occult Blod, POC     Card #2 Date     Card #3 Fecal Occult Blood, POC     Card #3 Date      Dg Abd 2 Views  Result Date: 03/19/2017 CLINICAL DATA:  Left lower quadrant abdominal pain for 2 weeks. EXAM: ABDOMEN - 2 VIEW COMPARISON:  None. FINDINGS: The bowel gas pattern is normal. There is no evidence of free air. No radio-opaque calculi or other significant radiographic abnormality is seen. IMPRESSION: Negative. Electronically Signed   By: Earle Gell M.D.   On: 03/19/2017 11:14   Abd XR personally reviewed - no concerns    ASSESSMENT/PLAN: The primary encounter diagnosis was Abdominal pain, LLQ. Diagnoses of Diarrhea of presumed infectious origin, Rectal itching, BRBPR (bright red blood per rectum), Nausea, and Urinary frequency were also pertinent to this visit.   I think likely colitis given duration of symptoms and significant LLQ on exam, I feel comfortable to treat empirically without obtaining CT unless there are concerns on labs or if patient worsens. See patient instructions. Plan was reviewed with him in detail and he is clear on RTC/ER precautions.   Cdiff seems unlikely based on Hx but will r/o given  healthcare exposures recently.    Orders Placed This Encounter  Procedures  . Stool Culture  . Stool C-Diff Toxin Assay  . DG Abd 2 Views  . CBC with Differential/Platelet  . CMP14+EGFR  . Lipase  . POCT Urinalysis Dipstick  . IFOBT POC (occult bld, rslt in office)    Outpatient Encounter Prescriptions as of 03/19/2017  Medication Sig  . ciprofloxacin (CIPRO) 500 MG tablet Take 1 tablet (500 mg total) by mouth 2 (two) times daily.  . hydrocortisone (ANUSOL-HC) 25 MG suppository Place 1 suppository (25 mg total) rectally 2 (two) times daily as needed for hemorrhoids or anal itching.  . metroNIDAZOLE (FLAGYL) 500 MG  tablet Take 1 tablet (500 mg total) by mouth 3 (three) times daily.  . ondansetron (ZOFRAN-ODT) 8 MG disintegrating tablet Take 1 tablet (8 mg total) by mouth every 8 (eight) hours as needed for nausea or vomiting.  . sildenafil (VIAGRA) 100 MG tablet Take 0.5-1 tablets (50-100 mg total) by mouth daily as needed for erectile dysfunction.  Marland Kitchen testosterone (ANDROGEL) 50 MG/5GM (1%) GEL Place 5 g onto the skin daily.   No facility-administered encounter medications on file as of 03/19/2017.        Patient Instructions   Plan:   Most viral infections or food poisoning that cause diarrhea go away in 7-10 days. Given persistent symptoms, we should think about other causes such as colitis or C. Diff infection or other.   We are getting some labs to evaluate, plus stool culture to rule out C. Diff or other unusual bacterial causes. I have sent medications for nausea and diarrhea, and I think it might be prudent to go ahead and start antibiotics for possible colitis.   If you get worse, especially if severe pain and/or fever, please seek emergency care as you may need a CT scan to conform the diagnosis. If labs are significantly abnormal, we may also ask you to get a CT scan. If you're getting better with treatment as above, no need to do anything else   See below for diet  recommendations: bowel rest with liquid progressing to bland diet may help symptoms as well      IF you received an x-ray today, you will receive an invoice from [facility] Radiology. Please contact [facility] Radiology at [phone] with questions or concerns regarding your invoice.   IF you received labwork today, you will receive an invoice from [facility]. Please contact [facility] at [phone] with questions or concerns regarding your invoice.   Our billing staff will not be able to assist you with questions regarding bills from these companies.  You will be contacted with the lab results as soon as they are available. The fastest way to get your results is to activate your My Chart account. Instructions are located on the last page of this paperwork. If you have not heard from [place] regarding the results in 2 weeks, please contact this office.         Visit summary with medication list and pertinent instructions was printed for patient to review. All questions at time of visit were answered - patient instructed to contact office with any additional concerns. ER/RTC precautions were reviewed with the patient. Follow-up plan: Return if symptoms worsen or fail to improve.  Note: Total time spent 40 minutes, greater than 50% of the visit was spent face-to-face counseling and coordinating care for the following: The primary encounter diagnosis was Abdominal pain, LLQ. Diagnoses of Diarrhea of presumed infectious origin, Rectal itching, BRBPR (bright red blood per rectum), Nausea, and Urinary frequency were also pertinent to this visit.Marland Kitchen  Please note: voice recognition software was used to produce this document, and typos may escape review. Please contact me for any needed clarifications.

## 2017-03-20 LAB — CMP14+EGFR
A/G RATIO: 2 (ref 1.2–2.2)
ALK PHOS: 58 IU/L (ref 39–117)
ALT: 61 IU/L — ABNORMAL HIGH (ref 0–44)
AST: 29 IU/L (ref 0–40)
Albumin: 4.9 g/dL (ref 3.5–5.5)
BUN/Creatinine Ratio: 11 (ref 9–20)
BUN: 12 mg/dL (ref 6–20)
Bilirubin Total: 0.4 mg/dL (ref 0.0–1.2)
CHLORIDE: 100 mmol/L (ref 96–106)
CO2: 23 mmol/L (ref 20–29)
Calcium: 10 mg/dL (ref 8.7–10.2)
Creatinine, Ser: 1.06 mg/dL (ref 0.76–1.27)
GFR calc Af Amer: 107 mL/min/{1.73_m2} (ref >59)
GFR calc non Af Amer: 92 mL/min/{1.73_m2} (ref >59)
GLOBULIN, TOTAL: 2.4 g/dL (ref 1.5–4.5)
Glucose: 94 mg/dL (ref 65–99)
POTASSIUM: 4.4 mmol/L (ref 3.5–5.2)
SODIUM: 140 mmol/L (ref 134–144)
Total Protein: 7.3 g/dL (ref 6.0–8.5)

## 2017-03-20 LAB — CBC WITH DIFFERENTIAL/PLATELET
Basophils Absolute: 0.1 10*3/uL (ref 0.0–0.2)
Basos: 1 %
EOS (ABSOLUTE): 0.4 10*3/uL (ref 0.0–0.4)
EOS: 4 %
Hematocrit: 45.4 % (ref 37.5–51.0)
Hemoglobin: 15.3 g/dL (ref 13.0–17.7)
Immature Grans (Abs): 0 10*3/uL (ref 0.0–0.1)
Immature Granulocytes: 0 %
LYMPHS ABS: 3.1 10*3/uL (ref 0.7–3.1)
Lymphs: 35 %
MCH: 30.7 pg (ref 26.6–33.0)
MCHC: 33.7 g/dL (ref 31.5–35.7)
MCV: 91 fL (ref 79–97)
MONOS ABS: 0.6 10*3/uL (ref 0.1–0.9)
Monocytes: 7 %
Neutrophils Absolute: 4.7 10*3/uL (ref 1.4–7.0)
Neutrophils: 53 %
Platelets: 339 10*3/uL (ref 150–379)
RBC: 4.99 x10E6/uL (ref 4.14–5.80)
RDW: 13.8 % (ref 12.3–15.4)
WBC: 8.9 10*3/uL (ref 3.4–10.8)

## 2017-03-20 LAB — LIPASE: LIPASE: 30 U/L (ref 13–78)

## 2017-03-21 ENCOUNTER — Encounter: Payer: Self-pay | Admitting: Osteopathic Medicine

## 2017-03-26 LAB — STOOL CULTURE: E coli, Shiga toxin Assay: NEGATIVE

## 2017-07-15 ENCOUNTER — Encounter: Payer: Self-pay | Admitting: Family Medicine

## 2017-07-15 ENCOUNTER — Ambulatory Visit (INDEPENDENT_AMBULATORY_CARE_PROVIDER_SITE_OTHER): Payer: Managed Care, Other (non HMO) | Admitting: Family Medicine

## 2017-07-15 ENCOUNTER — Other Ambulatory Visit: Payer: Self-pay

## 2017-07-15 DIAGNOSIS — E291 Testicular hypofunction: Secondary | ICD-10-CM

## 2017-07-15 MED ORDER — TESTOSTERONE 50 MG/5GM (1%) TD GEL: 5.0000 g | Freq: Every day | TRANSDERMAL | 1 refills | Status: DC

## 2017-07-15 NOTE — Patient Instructions (Addendum)
No change in medications for now. Follow-up next week for the physical and can perform fasting blood work at that time, including prostate testing, lipid testing, blood counts, liver tests, and testosterone levels. If testosterone still low at that time, may need to repeat levels after 6 weeks of medication.  Let me know if you have any questions in the meantime   IF you received an x-ray today, you will receive an invoice from [facility] Radiology. Please contact [facility] Radiology at [phone] with questions or concerns regarding your invoice.   IF you received labwork today, you will receive an invoice from [place]. Please contact [facility] at [phone] with questions or concerns regarding your invoice.   Our billing staff will not be able to assist you with questions regarding bills from these companies.  You will be contacted with the lab results as soon as they are available. The fastest way to get your results is to activate your My Chart account. Instructions are located on the last page of this paperwork. If you have not heard from [place] regarding the results in 2 weeks, please contact this office.

## 2017-07-15 NOTE — Progress Notes (Signed)
By signing my name below, I, Mayer Masker, attest that this documentation has been prepared under the direction and in the presence of Carlota Raspberry, Ranell Patrick, MD. Electronically Signed: Mayer Masker, Medical Scribe 07/15/2017 at 2:55 PM. Subjective:    Patient ID: [name], male    DOB: 1986-04-12, 32 y.o.   MRN: [id number] HPI  Chief Complaint  Patient presents with  . Bloodwork    patient takes Testosterone (Androgel)   [name] is a 32 y.o. male who presents to Primary Care at [facility] for follow up for hypogonadism. He is on androgel. Last blood work was July 2018, testosterone at that time was low at 178. He had been off his supplementation at that time. He has also used viagra 0.5 tablets at times. He is requesting a refill of androgel today. He is still using this 50mg  Q every morning, and is using this daily. He has has been off this medication for 1 week due to being busy with work. He has a physical scheduled in the office for next week. He denies libido changes. He still takes viagra PRN, he takes this every so often, and he takes this every time he is sexually active. He denies side effects to androgel like skin irritation.   He reports a recent (Aug 2018) hip surgery, which has impacted his daily activity and exercise.    Patient Active Problem List   Diagnosis Date Noted  . Abdominal pain, LLQ 03/19/2017  . Diarrhea of presumed infectious origin 03/19/2017  . BRBPR (bright red blood per rectum) 03/19/2017  . Paroxysmal atrial fibrillation ([place]) 12/27/2012   Past Medical History:  Diagnosis Date  . A-fib ([place])   . Heart murmur   . Mitral valve prolapse   . Paroxysmal atrial fibrillation ([facility])   . Testosterone deficiency    Past Surgical History:  Procedure Laterality Date  . HIP SURGERY    . NM MYOCAR PERF WALL MOTION  12/11/2004   [place], MD: no ischemia  . SURGERY SCROTAL / TESTICULAR  06/1994   torsion  . US ECHOCARDIOGRAPHY  10/06/2010   [place] mildly  dilated,trace MR  . WRIST SURGERY  05/2004   tendon repair   No Known Allergies Prior to Admission medications   Medication Sig Start Date End Date Taking? Authorizing Provider  sildenafil (VIAGRA) 100 MG tablet Take 0.5-1 tablets (50-100 mg total) by mouth daily as needed for erectile dysfunction. 12/13/16  Yes Wendie Agreste, MD  testosterone (ANDROGEL) 50 MG/5GM (1%) GEL Place 5 g onto the skin daily. 07/15/17  Yes Wendie Agreste, MD   Social History   Socioeconomic History  . Marital status: Single    Spouse name: Not on file  . Number of children: Not on file  . Years of education: Not on file  . Highest education level: Not on file  Social Needs  . Financial resource strain: Not on file  . Food insecurity - worry: Not on file  . Food insecurity - inability: Not on file  . Transportation needs - medical: Not on file  . Transportation needs - non-medical: Not on file  Occupational History  . Not on file  Tobacco Use  . Smoking status: Former Smoker    Types: Cigars  . Smokeless tobacco: Never Used  Substance and Sexual Activity  . Alcohol use: Yes    Comment: social  . Drug use: No  . Sexual activity: Yes    Birth control/protection: None  Other Topics Concern  .  Not on file  Social History Narrative  . Not on file   Vitals:   07/15/17 1448  BP: 112/84  Pulse: 98  Resp: 16  Temp: 99.2 F (37.3 C)  TempSrc: Oral  SpO2: 98%  Weight: 227 lb 9.6 oz (103.2 kg)  Height: 6\' 2"  (1.88 m)   Review of Systems  Genitourinary:       Negative for: libido changes  Skin:       Negative for: skin irritation.       Objective:   Physical Exam  Constitutional: He is oriented to person, place, and time. He appears well-developed and well-nourished.  HENT:  Head: Normocephalic and atraumatic.  Eyes: EOM are normal. Pupils are equal, round, and reactive to light.  Neck: No JVD present. Carotid bruit is not present.  Cardiovascular: Normal rate, regular rhythm and  normal heart sounds.  No murmur heard. Pulmonary/Chest: Effort normal and breath sounds normal. He has no rales.  Musculoskeletal: He exhibits no edema.  Neurological: He is alert and oriented to person, place, and time.  Skin: Skin is warm and dry.  Psychiatric: He has a normal mood and affect.  Vitals reviewed.     Assessment & Plan:   [name] is a 32 y.o. male Hypogonadism male - Plan: testosterone (ANDROGEL) 50 MG/5GM (1%) GEL Was tolerating AndroGel without difficulty. Unfortunately has been off past week. Also not fasting for today's visit. He does have a physical scheduled next week and plans on fasting blood work at that time.  -Continue AndroGel 50 mg one packet per day, 3 month prescription with one refill provided.  -labs next week. If testosterone low, plan for lab only visit in 6 weeks as may need to be on medication for more time for accurate reading.  Meds ordered this encounter  Medications  . testosterone (ANDROGEL) 50 MG/5GM (1%) GEL    Sig: Place 5 g onto the skin daily.    Dispense:  90 Tube    Refill:  1   Patient Instructions   No change in medications for now. Follow-up next week for the physical and can perform fasting blood work at that time, including prostate testing, lipid testing, blood counts, liver tests, and testosterone levels. If testosterone still low at that time, may need to repeat levels after 6 weeks of medication.  Let me know if you have any questions in the meantime   IF you received an x-ray today, you will receive an invoice from [facility]. Please contact [facility] at [phone] with questions or concerns regarding your invoice.   IF you received labwork today, you will receive an invoice from [place]. Please contact [facility] at [phone] with questions or concerns regarding your invoice.   Our billing staff will not be able to assist you with questions regarding bills from these companies.  You  will be contacted with the lab results as soon as they are available. The fastest way to get your results is to activate your My Chart account. Instructions are located on the last page of this paperwork. If you have not heard from [place] regarding the results in 2 weeks, please contact this office.      I personally performed the services described in this documentation, which was scribed in my presence. The recorded information has been reviewed and considered for accuracy and completeness, addended by me as needed, and agree with information above.  Signed,   [name], MD Primary Care at [facility].  07/15/17 3:40 PM

## 2017-07-21 ENCOUNTER — Other Ambulatory Visit: Payer: Self-pay

## 2017-07-21 ENCOUNTER — Encounter: Payer: Self-pay | Admitting: Family Medicine

## 2017-07-21 ENCOUNTER — Ambulatory Visit (INDEPENDENT_AMBULATORY_CARE_PROVIDER_SITE_OTHER): Payer: Managed Care, Other (non HMO) | Admitting: Family Medicine

## 2017-07-21 ENCOUNTER — Telehealth: Payer: Self-pay | Admitting: Family Medicine

## 2017-07-21 VITALS — BP 118/70 | HR 83 | Temp 98.0°F | Resp 18 | Ht 72.64 in | Wt 225.0 lb

## 2017-07-21 DIAGNOSIS — Z131 Encounter for screening for diabetes mellitus: Secondary | ICD-10-CM | POA: Diagnosis not present

## 2017-07-21 DIAGNOSIS — Z13220 Encounter for screening for lipoid disorders: Secondary | ICD-10-CM | POA: Diagnosis not present

## 2017-07-21 DIAGNOSIS — E291 Testicular hypofunction: Secondary | ICD-10-CM

## 2017-07-21 DIAGNOSIS — Z0000 Encounter for general adult medical examination without abnormal findings: Secondary | ICD-10-CM

## 2017-07-21 DIAGNOSIS — Z1329 Encounter for screening for other suspected endocrine disorder: Secondary | ICD-10-CM

## 2017-07-21 NOTE — Telephone Encounter (Signed)
Phone call to patient's [facility] pharmacy. Information for prior auth for testosterone gel provided by pharmacy.  Phone call to [employer], spoke with Moore. Testosterone gel authorized - approval [id number]. Pharmacy will call to notify patient. Closing note.

## 2017-07-21 NOTE — Patient Instructions (Addendum)
We are working on the authorization for the AndroGel.   Testosterone and other blood tests will be obtained today. Plan for repeat testing once you are on medicines for 6 weeks (lab only visit ok).  Follow-up with me in 6 months. Let me know if there are questions.  Keeping you healthy  Get these tests  Blood pressure- Have your blood pressure checked once a year by your healthcare provider.  Normal blood pressure is 120/80.  Weight- Have your body mass index (BMI) calculated to screen for obesity.  BMI is a measure of body fat based on height and weight. You can also calculate your own BMI at GravelBags.it.  Cholesterol- Have your cholesterol checked regularly starting at age 58, sooner may be necessary if you have diabetes, high blood pressure, if a family member developed heart diseases at an early age or if you smoke.   Chlamydia, HIV, and other sexual transmitted disease- Get screened each year until the age of 92 then within three months of each new sexual partner.  Diabetes- Have your blood sugar checked regularly if you have high blood pressure, high cholesterol, a family history of diabetes or if you are overweight.  Get these vaccines  Flu shot- Every fall.  Tetanus shot- Every 10 years.  Menactra- Single dose; prevents meningitis.  Take these steps  Don't smoke- If you do smoke, ask your healthcare provider about quitting. For tips on how to quit, go to www.smokefree.gov or call 1-800-QUIT-NOW.  Be physically active- Exercise 5 days a week for at least 30 minutes.  If you are not already physically active start slow and gradually work up to 30 minutes of moderate physical activity.  Examples of moderate activity include walking briskly, mowing the yard, dancing, swimming bicycling, etc.  Eat a healthy diet- Eat a variety of healthy foods such as fruits, vegetables, low fat milk, low fat cheese, yogurt, lean meats, poultry, fish, beans, tofu, etc.  For more  information on healthy eating, go to www.thenutritionsource.org  Drink alcohol in moderation- Limit alcohol intake two drinks or less a day.  Never drink and drive.  Dentist- Brush and floss teeth twice daily; visit your dentis twice a year.  Depression-Your emotional health is as important as your physical health.  If you're feeling down, losing interest in things you normally enjoy please talk with your healthcare provider.  Gun Safety- If you keep a gun in your home, keep it unloaded and with the safety lock on.  Bullets should be stored separately.  Helmet use- Always wear a helmet when riding a motorcycle, bicycle, rollerblading or skateboarding.  Safe sex- If you may be exposed to a sexually transmitted infection, use a condom  Seat belts- Seat bels can save your life; always wear one.  Smoke/Carbon Monoxide detectors- These detectors need to be installed on the appropriate level of your home.  Replace batteries at least once a year.  Skin Cancer- When out in the sun, cover up and use sunscreen SPF 15 or higher.  Violence- If anyone is threatening or hurting you, please tell your healthcare provider.   IF you received an x-ray today, you will receive an invoice from [facility]. Please contact [facility] at [phone] with questions or concerns regarding your invoice.   IF you received labwork today, you will receive an invoice from [place]. Please contact [facility] at [phone] with questions or concerns regarding your invoice.   Our billing staff will not be able to assist you with questions  regarding bills from these companies.  You will be contacted with the lab results as soon as they are available. The fastest way to get your results is to activate your My Chart account. Instructions are located on the last page of this paperwork. If you have not heard from [place] regarding the results in 2 weeks, please contact this office.

## 2017-07-21 NOTE — Progress Notes (Signed)
Subjective:  By signing my name below, I, Theresia Bough, attest that this documentation has been prepared under the direction and in the presence of Carlota Raspberry Ranell Patrick, MD.  Electronically Signed: Theresia Bough, Medical Scribe 07/21/17 at 3:14 PM   Patient ID: [name], male    DOB: March 22, 1986, 32 y.o.   MRN: [id number] Chief Complaint  Patient presents with  . Annual Exam   HPI [name] is a 32 y.o. male who presents to Primary Care at [facility] for annual physical exam. He has hx of Hypogonadism treated with Androgel. I saw him 6 days ago and restarted medication. Planned for fasting blood work today with likely repeat testosterone levels in 6 weeks.   Hypogonadism  He had fasting blood work this mornng at 8:30 AM. He states he is not on Androgel right now. He states the pharmacy required more information from provider. He has not been on testosterone for 3 weeks.   Immunizations  Immunization History  Administered Date(s) Administered  . Influenza-Unspecified 03/14/2015  . Tdap 09/30/2014    Vision  Visual Acuity Screening   Right eye Left eye Both eyes  Without correction: 20/15 20/15 20/15   With correction:       Dentist  He has a Psychiatric nurse and sees a dentist regularly.   Depression Screening Depression screen [facility] 2/9 07/21/2017 07/15/2017 03/19/2017 12/13/2016 07/08/2016  Decreased Interest 0 0 0 0 0  Down, Depressed, Hopeless 0 0 0 0 0  PHQ - 2 Score 0 0 0 0 0    Exercise  He states he does exercise regularly but his level has been decreased as of late due to some mild left hip pain.   STI Testing He declined testing at this time.   Tobacco/Alcohol use He denies Tobacco use and states he drinks about 3 drink per week.   FMHx His mother had breast cancer and his paternal grandfather had prostate cancer.   Patient Active Problem List   Diagnosis Date Noted  . Abdominal pain, LLQ 03/19/2017  . Diarrhea of presumed infectious  origin 03/19/2017  . BRBPR (bright red blood per rectum) 03/19/2017  . Paroxysmal atrial fibrillation ([place]) 12/27/2012   Past Medical History:  Diagnosis Date  . A-fib ([place])   . Heart murmur   . Mitral valve prolapse   . Paroxysmal atrial fibrillation ([facility])   . Testosterone deficiency    Past Surgical History:  Procedure Laterality Date  . HIP SURGERY    . NM MYOCAR PERF WALL MOTION  12/11/2004   [place], MD: no ischemia  . SURGERY SCROTAL / TESTICULAR  06/1994   torsion  . US ECHOCARDIOGRAPHY  10/06/2010   [place] mildly dilated,trace MR  . WRIST SURGERY  05/2004   tendon repair   No Known Allergies Prior to Admission medications   Medication Sig Start Date End Date Taking? Authorizing Provider  sildenafil (VIAGRA) 100 MG tablet Take 0.5-1 tablets (50-100 mg total) by mouth daily as needed for erectile dysfunction. 12/13/16  Yes Wendie Agreste, MD  testosterone (ANDROGEL) 50 MG/5GM (1%) GEL Place 5 g onto the skin daily. 07/15/17  Yes Wendie Agreste, MD   Social History   Socioeconomic History  . Marital status: Single    Spouse name: Not on file  . Number of children: Not on file  . Years of education: Not on file  . Highest education level: Not on file  Social Needs  . Financial resource strain: Not on file  .  Food insecurity - worry: Not on file  . Food insecurity - inability: Not on file  . Transportation needs - medical: Not on file  . Transportation needs - non-medical: Not on file  Occupational History  . Not on file  Tobacco Use  . Smoking status: Former Smoker    Types: Cigars  . Smokeless tobacco: Never Used  Substance and Sexual Activity  . Alcohol use: Yes    Comment: social  . Drug use: No  . Sexual activity: Yes    Birth control/protection: None  Other Topics Concern  . Not on file  Social History Narrative  . Not on file   Review of Systems 13 point ROS negative other than positive for body aches     Objective:   Physical Exam    Constitutional: He is oriented to person, place, and time. He appears well-developed and well-nourished.  HENT:  Head: Normocephalic and atraumatic.  Right Ear: External ear normal.  Left Ear: External ear normal.  Mouth/Throat: Oropharynx is clear and moist.  Eyes: Conjunctivae and EOM are normal. Pupils are equal, round, and reactive to light.  Neck: Normal range of motion. Neck supple. No thyromegaly present.  Cardiovascular: Normal rate, regular rhythm, normal heart sounds and intact distal pulses.  Pulmonary/Chest: Effort normal and breath sounds normal. No respiratory distress. He has no wheezes.  Abdominal: Soft. He exhibits no distension. There is no tenderness.  Musculoskeletal: Normal range of motion. He exhibits no edema or tenderness.  Lymphadenopathy:    He has no cervical adenopathy.  Neurological: He is alert and oriented to person, place, and time. He has normal reflexes.  Skin: Skin is warm and dry.  Psychiatric: He has a normal mood and affect. His behavior is normal.  Vitals reviewed.     Vitals:   07/21/17 1403  BP: 118/70  Pulse: 83  Resp: 18  Temp: 98 F (36.7 C)  TempSrc: Oral  SpO2: 98%  Weight: 225 lb (102.1 kg)  Height: 6' 0.64" (1.845 m)       Assessment & Plan:  [name] is a 32 y.o. male Annual physical exam - Plan: CBC with Differential/Platelet, Comprehensive metabolic panel, Lipid panel, TSH  - -anticipatory guidance as below in AVS, screening labs above. Health maintenance items as above in HPI discussed/recommended as applicable.   Hypogonadism male - Plan: PSA, Testosterone, Free, Total, SHBG, Testosterone, Free, Total, SHBG  - check baseline levels (off meds for few weeks), and other monitoring above. androgel authorizaition completed. recheck 6 weeks on meds for lab visit.    Screening for diabetes mellitus - Plan: Comprehensive metabolic panel.    Screening for hyperlipidemia - Plan: Comprehensive metabolic panel, Lipid  panel  Screening for thyroid disorder   No orders of the defined types were placed in this encounter.  Patient Instructions    We are working on the authorization for the AndroGel.   Testosterone and other blood tests will be obtained today. Plan for repeat testing once you are on medicines for 6 weeks (lab only visit ok).  Follow-up with me in 6 months. Let me know if there are questions.  Keeping you healthy  Get these tests  Blood pressure- Have your blood pressure checked once a year by your healthcare provider.  Normal blood pressure is 120/80.  Weight- Have your body mass index (BMI) calculated to screen for obesity.  BMI is a measure of body fat based on height and weight. You can also calculate your own  BMI at [employer]Bags.it.  Cholesterol- Have your cholesterol checked regularly starting at age 67, sooner may be necessary if you have diabetes, high blood pressure, if a family member developed heart diseases at an early age or if you smoke.   Chlamydia, HIV, and other sexual transmitted disease- Get screened each year until the age of 27 then within three months of each new sexual partner.  Diabetes- Have your blood sugar checked regularly if you have high blood pressure, high cholesterol, a family history of diabetes or if you are overweight.  Get these vaccines  Flu shot- Every fall.  Tetanus shot- Every 10 years.  Menactra- Single dose; prevents meningitis.  Take these steps  Don't smoke- If you do smoke, ask your healthcare provider about quitting. For tips on how to quit, go to www.smokefree.gov or call 1-800-QUIT-NOW.  Be physically active- Exercise 5 days a week for at least 30 minutes.  If you are not already physically active start slow and gradually work up to 30 minutes of moderate physical activity.  Examples of moderate activity include walking briskly, mowing the yard, dancing, swimming bicycling, etc.  Eat a healthy diet- Eat a variety of  healthy foods such as fruits, vegetables, low fat milk, low fat cheese, yogurt, lean meats, poultry, fish, beans, tofu, etc.  For more information on healthy eating, go to www.thenutritionsource.org  Drink alcohol in moderation- Limit alcohol intake two drinks or less a day.  Never drink and drive.  Dentist- Brush and floss teeth twice daily; visit your dentis twice a year.  Depression-Your emotional health is as important as your physical health.  If you're feeling down, losing interest in things you normally enjoy please talk with your healthcare provider.  Gun Safety- If you keep a gun in your home, keep it unloaded and with the safety lock on.  Bullets should be stored separately.  Helmet use- Always wear a helmet when riding a motorcycle, bicycle, rollerblading or skateboarding.  Safe sex- If you may be exposed to a sexually transmitted infection, use a condom  Seat belts- Seat bels can save your life; always wear one.  Smoke/Carbon Monoxide detectors- These detectors need to be installed on the appropriate level of your home.  Replace batteries at least once a year.  Skin Cancer- When out in the sun, cover up and use sunscreen SPF 15 or higher.  Violence- If anyone is threatening or hurting you, please tell your healthcare provider.   IF you received an x-ray today, you will receive an invoice from [facility]. Please contact [facility] at [phone] with questions or concerns regarding your invoice.   IF you received labwork today, you will receive an invoice from [facility]. Please contact [facility] at [phone] with questions or concerns regarding your invoice.   Our billing staff will not be able to assist you with questions regarding bills from these companies.  You will be contacted with the lab results as soon as they are available. The fastest way to get your results is to activate your My Chart account. Instructions are located on the last page of this  paperwork. If you have not heard from [place] regarding the results in 2 weeks, please contact this office.       I personally performed the services described in this documentation, which was scribed in my presence. The recorded information has been reviewed and considered for accuracy and completeness, addended by me as needed, and agree with information above.  Signed,   [name],  MD Primary Care at [place].  07/23/17 11:55 PM

## 2017-07-22 ENCOUNTER — Telehealth: Payer: Self-pay | Admitting: Family Medicine

## 2017-07-22 DIAGNOSIS — E291 Testicular hypofunction: Secondary | ICD-10-CM

## 2017-07-22 LAB — COMPREHENSIVE METABOLIC PANEL
A/G RATIO: 1.6 (ref 1.2–2.2)
ALK PHOS: 51 IU/L (ref 39–117)
ALT: 52 IU/L — ABNORMAL HIGH (ref 0–44)
AST: 23 IU/L (ref 0–40)
Albumin: 4.7 g/dL (ref 3.5–5.5)
BILIRUBIN TOTAL: 0.6 mg/dL (ref 0.0–1.2)
BUN / CREAT RATIO: 14 (ref 9–20)
BUN: 14 mg/dL (ref 6–20)
CO2: 24 mmol/L (ref 20–29)
CREATININE: 0.98 mg/dL (ref 0.76–1.27)
Calcium: 9.7 mg/dL (ref 8.7–10.2)
Chloride: 103 mmol/L (ref 96–106)
GFR calc Af Amer: 117 mL/min/{1.73_m2} (ref >59)
GFR calc non Af Amer: 102 mL/min/{1.73_m2} (ref >59)
GLOBULIN, TOTAL: 2.9 g/dL (ref 1.5–4.5)
Glucose: 86 mg/dL (ref 65–99)
POTASSIUM: 4.2 mmol/L (ref 3.5–5.2)
SODIUM: 142 mmol/L (ref 134–144)
Total Protein: 7.6 g/dL (ref 6.0–8.5)

## 2017-07-22 LAB — LIPID PANEL
CHOLESTEROL TOTAL: 206 mg/dL — AB (ref 100–199)
Chol/HDL Ratio: 3.1 ratio (ref 0.0–5.0)
HDL: 67 mg/dL (ref >39)
LDL CALC: 120 mg/dL — AB (ref 0–99)
Triglycerides: 95 mg/dL (ref 0–149)
VLDL Cholesterol Cal: 19 mg/dL (ref 5–40)

## 2017-07-22 LAB — TSH: TSH: 1.95 u[IU]/mL (ref 0.450–4.500)

## 2017-07-22 LAB — CBC WITH DIFFERENTIAL/PLATELET
Basophils Absolute: 0.1 10*3/uL (ref 0.0–0.2)
Basos: 1 %
EOS (ABSOLUTE): 0.3 10*3/uL (ref 0.0–0.4)
EOS: 4 %
HEMATOCRIT: 45.6 % (ref 37.5–51.0)
Hemoglobin: 15.3 g/dL (ref 13.0–17.7)
Immature Grans (Abs): 0 10*3/uL (ref 0.0–0.1)
Immature Granulocytes: 0 %
LYMPHS ABS: 3.2 10*3/uL — AB (ref 0.7–3.1)
Lymphs: 39 %
MCH: 30.1 pg (ref 26.6–33.0)
MCHC: 33.6 g/dL (ref 31.5–35.7)
MCV: 90 fL (ref 79–97)
MONOCYTES: 8 %
Monocytes Absolute: 0.6 10*3/uL (ref 0.1–0.9)
NEUTROS ABS: 3.9 10*3/uL (ref 1.4–7.0)
Neutrophils: 48 %
Platelets: 317 10*3/uL (ref 150–379)
RBC: 5.08 x10E6/uL (ref 4.14–5.80)
RDW: 14 % (ref 12.3–15.4)
WBC: 8.1 10*3/uL (ref 3.4–10.8)

## 2017-07-22 LAB — TESTOSTERONE, FREE, TOTAL, SHBG
SEX HORMONE BINDING: 17.4 nmol/L (ref 16.5–55.9)
Testosterone, Free: 8.5 pg/mL — ABNORMAL LOW (ref 8.7–25.1)
Testosterone: 201 ng/dL — ABNORMAL LOW (ref 264–916)

## 2017-07-22 LAB — PSA: Prostate Specific Ag, Serum: 0.7 ng/mL (ref 0.0–4.0)

## 2017-07-22 MED ORDER — TESTOSTERONE 50 MG/5GM (1%) TD GEL: 5.0000 g | Freq: Every day | TRANSDERMAL | 1 refills | Status: DC

## 2017-07-22 NOTE — Telephone Encounter (Signed)
Copied from [place] [id number]. Topic: Quick Communication - Rx Refill/Question >> Jul 22, 2017  9:46 AM Percell Belt A wrote: Medication:  testosterone (ANDROGEL) 50 MG/5GM (1%) GEL [[id number]]    Has the patient contacted their pharmacy?no    (Agent: If no, request that the patient contact the pharmacy for the refill.)   Preferred Pharmacy (with phone number [facility] street [facility] - pt cancelled order at local pharmacy on 2/22.  It was to expensive. He can save money if it goes to mail order    Agent: Please be advised that RX refills may take up to 3 business days. We ask that you follow-up with your pharmacy.

## 2017-07-25 ENCOUNTER — Telehealth: Payer: Self-pay

## 2017-07-25 NOTE — Telephone Encounter (Signed)
Received PA for Androgel, called insurance company. PA Approved on 07/21/2017

## 2017-07-28 ENCOUNTER — Telehealth: Payer: Self-pay

## 2017-07-28 NOTE — Telephone Encounter (Signed)
Received notice from [facility] regarding patient medication coverage request for  Testosterone 50 mg 1% Gel.  Approved 06/21/2017 through 07/21/2018

## 2017-09-06 ENCOUNTER — Other Ambulatory Visit: Payer: Self-pay | Admitting: Family Medicine

## 2017-09-06 DIAGNOSIS — E291 Testicular hypofunction: Secondary | ICD-10-CM

## 2017-09-06 NOTE — Telephone Encounter (Signed)
Copied from [place] [id number]. Topic: Quick Communication - Rx Refill/Question >> Sep 06, 2017 12:32 PM Oliver Pila B wrote: Medication: testosterone (ANDROGEL) 50 MG/5GM (1%) GEL [[id number]]  Has the patient contacted their pharmacy? Yes.   (Agent: If no, request that the patient contact the pharmacy for the refill.) [facility] Agent: Please be advised that RX refills may take up to 3 business days. We ask that you follow-up with your pharmacy.

## 2017-09-06 NOTE — Telephone Encounter (Signed)
Refill of Testosterone  LOV 07/21/17  Dr. Carlota Raspberry  [facility]

## 2017-09-07 ENCOUNTER — Other Ambulatory Visit: Payer: Self-pay

## 2017-09-07 DIAGNOSIS — E291 Testicular hypofunction: Secondary | ICD-10-CM

## 2017-09-07 MED ORDER — TESTOSTERONE 50 MG/5GM (1%) TD GEL: 5.0000 g | Freq: Every day | TRANSDERMAL | 1 refills | Status: DC

## 2017-09-07 NOTE — Progress Notes (Signed)
Signed and placed in "to fax" box at [address] with [facility] fax number highlighted

## 2017-09-07 NOTE — Telephone Encounter (Signed)
Come into office yesterday requesting refill. [facility] since [name] is out of office.

## 2017-09-07 NOTE — Progress Notes (Signed)
The e-rx is not registering so printed.

## 2017-09-07 NOTE — Telephone Encounter (Signed)
Spoke with patient.   Never got the Androgel filled due to cost at local pharmacy.  Pt has requested that we send it to the [facility].  Medication has been canceled at local pharmacy and now pended to [facility].    Please advise on refill.   Thanks, Lucillie Garfinkel, [place]

## 2017-11-01 ENCOUNTER — Ambulatory Visit: Payer: Managed Care, Other (non HMO) | Admitting: Family Medicine

## 2017-11-01 DIAGNOSIS — M76892 Other specified enthesopathies of left lower limb, excluding foot: Secondary | ICD-10-CM | POA: Diagnosis not present

## 2017-11-04 ENCOUNTER — Other Ambulatory Visit: Payer: Self-pay

## 2017-11-04 ENCOUNTER — Encounter: Payer: Self-pay | Admitting: Family Medicine

## 2017-11-04 ENCOUNTER — Ambulatory Visit (INDEPENDENT_AMBULATORY_CARE_PROVIDER_SITE_OTHER): Payer: 59 | Admitting: Family Medicine

## 2017-11-04 VITALS — BP 118/70 | HR 65 | Temp 98.4°F | Ht 73.0 in | Wt 218.2 lb

## 2017-11-04 DIAGNOSIS — E291 Testicular hypofunction: Secondary | ICD-10-CM | POA: Diagnosis not present

## 2017-11-04 MED ORDER — TESTOSTERONE 50 MG/5GM (1%) TD GEL: 5.0000 g | Freq: Every day | TRANSDERMAL | 1 refills | Status: DC

## 2017-11-04 NOTE — Progress Notes (Signed)
Subjective:  By signing my name below, I, [name], attest that this documentation has been prepared under the direction and in the presence of Xavier Ray, MD. Electronically Signed: [name], [place]. 11/04/2017 , 11:50 AM .  Patient was seen in Room 3 .   Patient ID: [name], male    DOB: 1986/02/15, 32 y.o.   MRN: [id number] Chief Complaint  Patient presents with  . Medication Refill    Testosterone base gel   HPI [name] is a 32 y.o. male Here for follow up hypogonadism. He was last seen on Feb 28th for a physical, testing was deferred as he had been off medication for approximately 1 week at that time. He had restarted AndroGel exactly 6 weeks ago, applying over his right arm. He denies any skin irritation or rash with AndroGel. He denies abdominal pain, urinary symptoms, irritability or agitation.   He also wants to double check immunizations because his twin brother and sister-in-law will have a baby in Sept. His last Tdap was in May 2016. This is the first nephew in the family.   Patient Active Problem List   Diagnosis Date Noted  . Abdominal pain, LLQ 03/19/2017  . Diarrhea of presumed infectious origin 03/19/2017  . BRBPR (bright red Marsh per rectum) 03/19/2017  . Paroxysmal atrial fibrillation ([place]) 12/27/2012   Past Medical History:  Diagnosis Date  . A-fib ([place])   . Heart murmur   . Mitral valve prolapse   . Paroxysmal atrial fibrillation ([facility])   . Testosterone deficiency    Past Surgical History:  Procedure Laterality Date  . HIP SURGERY    . NM MYOCAR PERF WALL MOTION  12/11/2004   [place], MD: no ischemia  . SURGERY SCROTAL / TESTICULAR  06/1994   torsion  . US ECHOCARDIOGRAPHY  10/06/2010   [place] mildly dilated,trace MR  . WRIST SURGERY  05/2004   tendon repair   No Known Allergies Prior to Admission medications   Medication Sig Start Date End Date Taking? Authorizing Provider  sildenafil (VIAGRA) 100 MG tablet Take  0.5-1 tablets (50-100 mg total) by mouth daily as needed for erectile dysfunction. 12/13/16   Wendie Agreste, MD  testosterone (ANDROGEL) 50 MG/5GM (1%) GEL Place 5 g onto the skin daily. 09/07/17   Shawnee Knapp, MD   Social History   Socioeconomic History  . Marital status: Single    Spouse name: Not on file  . Number of children: Not on file  . Years of education: Not on file  . Highest education level: Not on file  Occupational History  . Not on file  Social Needs  . Financial resource strain: Not on file  . Food insecurity:    Worry: Not on file    Inability: Not on file  . Transportation needs:    Medical: Not on file    Non-medical: Not on file  Tobacco Use  . Smoking status: Former Smoker    Types: Cigars  . Smokeless tobacco: Never Used  Substance and Sexual Activity  . Alcohol use: Yes    Comment: social  . Drug use: No  . Sexual activity: Yes    Birth control/protection: None  Lifestyle  . Physical activity:    Days per week: Not on file    Minutes per session: Not on file  . Stress: Not on file  Relationships  . Social connections:    Talks on phone: Not on file    Gets  together: Not on file    Attends religious service: Not on file    Active member of club or organization: Not on file    Attends meetings of clubs or organizations: Not on file    Relationship status: Not on file  . Intimate partner violence:    Fear of current or ex partner: Not on file    Emotionally abused: Not on file    Physically abused: Not on file    Forced sexual activity: Not on file  Other Topics Concern  . Not on file  Social History Narrative  . Not on file   Review of Systems  Constitutional: Negative for fatigue and unexpected weight change.  Eyes: Negative for visual disturbance.  Respiratory: Negative for cough, chest tightness and shortness of breath.   Cardiovascular: Negative for chest pain, palpitations and leg swelling.  Gastrointestinal: Negative for  abdominal pain and Marsh in stool.  Genitourinary: Negative.   Skin: Negative for rash and wound.  Neurological: Negative for dizziness, light-headedness and headaches.  Psychiatric/Behavioral: Negative for agitation.       Objective:   Physical Exam  Constitutional: He is oriented to person, place, and time. He appears well-developed and well-nourished. No distress.  HENT:  Head: Normocephalic and atraumatic.  Eyes: Pupils are equal, round, and reactive to light. EOM are normal.  Neck: Neck supple.  Cardiovascular: Normal rate.  Pulmonary/Chest: Effort normal. No respiratory distress.  Musculoskeletal: Normal range of motion.  Neurological: He is alert and oriented to person, place, and time.  Skin: Skin is warm and dry.  No skin irritation over right arm  Psychiatric: He has a normal mood and affect. His behavior is normal.  Nursing note and vitals reviewed.   Vitals:   11/04/17 1106 11/04/17 1109  BP: (!) 134/96 118/70  Pulse: 65   Temp: 98.4 F (36.9 C)   TempSrc: Oral   SpO2: 99%   Weight: 218 lb 3.2 oz (99 kg)   Height: 6\' 1"  (1.854 m)        Assessment & Plan:   [name] is a 32 y.o. male Hypogonadism in male - Plan: Testosterone, Free, Total, SHBG  Hypogonadism male - Plan: testosterone (ANDROGEL) 50 MG/5GM (1%) GEL  -Now 6 weeks into treatment.  Check testosterone levels, continue same dose AndroGel for now.  Recheck in 6 months with PSA, other monitoring test at that time.  Meds ordered this encounter  Medications  . testosterone (ANDROGEL) 50 MG/5GM (1%) GEL    Sig: Place 5 g onto the skin daily.    Dispense:  450 g    Refill:  1   Patient Instructions   Recheck in 6 months for labs. No change in Androgel dose for now.      IF you received an x-Marsh today, you will receive an invoice from [facility]. Please contact [facility] at [phone] with questions or concerns regarding your invoice.   IF you received  labwork today, you will receive an invoice from [facility]. Please contact [facility] at [phone] with questions or concerns regarding your invoice.   Our billing staff will not be able to assist you with questions regarding bills from these companies.  You will be contacted with the lab results as soon as they are available. The fastest way to get your results is to activate your My Chart account. Instructions are located on the last page of this paperwork. If you have not heard from [place] regarding the results in 2  weeks, please contact this office.       I personally performed the services described in this documentation, which was scribed in my presence. The recorded information has been reviewed and considered for accuracy and completeness, addended by me as needed, and agree with information above.  Signed,   Xavier Ray, MD [facility] [place].  11/06/17 5:39 PM

## 2017-11-04 NOTE — Patient Instructions (Addendum)
Recheck in 6 months for labs. No change in Androgel dose for now.      IF you received an x-ray today, you will receive an invoice from [facility]. Please contact [facility] at [phone] with questions or concerns regarding your invoice.   IF you received labwork today, you will receive an invoice from [place]. Please contact [facility] at [phone] with questions or concerns regarding your invoice.   Our billing staff will not be able to assist you with questions regarding bills from these companies.  You will be contacted with the lab results as soon as they are available. The fastest way to get your results is to activate your My Chart account. Instructions are located on the last page of this paperwork. If you have not heard from [place] regarding the results in 2 weeks, please contact this office.

## 2017-11-05 LAB — TESTOSTERONE, FREE, TOTAL, SHBG
SEX HORMONE BINDING: 23.6 nmol/L (ref 16.5–55.9)
Testosterone, Free: 10.8 pg/mL (ref 8.7–25.1)
Testosterone: 402 ng/dL (ref 264–916)

## 2017-11-06 ENCOUNTER — Encounter: Payer: Self-pay | Admitting: Family Medicine

## 2017-11-06 ENCOUNTER — Ambulatory Visit (HOSPITAL_COMMUNITY)
Admission: EM | Admit: 2017-11-06 | Discharge: 2017-11-06 | Disposition: A | Discharge status: Home / Self Care | Payer: 59 | Attending: Urgent Care | Admitting: Urgent Care

## 2017-11-06 ENCOUNTER — Encounter (HOSPITAL_COMMUNITY): Payer: Self-pay | Admitting: Emergency Medicine

## 2017-11-06 DIAGNOSIS — L237 Allergic contact dermatitis due to plants, except food: Secondary | ICD-10-CM

## 2017-11-06 DIAGNOSIS — L299 Pruritus, unspecified: Secondary | ICD-10-CM

## 2017-11-06 MED ORDER — HYDROXYZINE HCL 25 MG PO TABS
25.0000 mg | ORAL_TABLET | Freq: Two times a day (BID) | ORAL | 0 refills | Status: DC | PRN
Start: 2017-11-06 — End: 2021-02-11

## 2017-11-06 MED ORDER — PREDNISONE 10 MG PO TABS: ORAL_TABLET | ORAL | 0 refills | Status: DC

## 2017-11-06 NOTE — ED Notes (Signed)
Bed: UC01 Expected date:  Expected time:  Means of arrival:  Comments: 

## 2017-11-06 NOTE — ED Triage Notes (Signed)
Pt here for rash and itching

## 2017-11-06 NOTE — ED Provider Notes (Signed)
  MRN: [id number] DOB: 09-04-1985  Subjective:   [name] is a 32 y.o. male presenting for 1 day history of rash from what he thinks is poison ivy.  Patient was working in the yard, pulling out a lot of vines.  He subsequently developed a rash over his hands and forearms that has now spread to the genital area and his face.  States that the itching has not been well controlled with Benadryl.  He also feels congested in his nose.  Denies fever, chest tightness, shortness of breath, wheezing, nausea, vomiting, belly pain.  No current facility-administered medications for this encounter.   Current Outpatient Medications:  .  sildenafil (VIAGRA) 100 MG tablet, Take 0.5-1 tablets (50-100 mg total) by mouth daily as needed for erectile dysfunction., Disp: 5 tablet, Rfl: 11 .  testosterone (ANDROGEL) 50 MG/5GM (1%) GEL, Place 5 g onto the skin daily., Disp: 450 g, Rfl: 1   No Known Allergies  Past Medical History:  Diagnosis Date  . A-fib ([place])   . Heart murmur   . Mitral valve prolapse   . Paroxysmal atrial fibrillation ([facility])   . Testosterone deficiency      Past Surgical History:  Procedure Laterality Date  . HIP SURGERY    . NM MYOCAR PERF WALL MOTION  12/11/2004   [place], MD: no ischemia  . SURGERY SCROTAL / TESTICULAR  06/1994   torsion  . US ECHOCARDIOGRAPHY  10/06/2010   [place] mildly dilated,trace MR  . WRIST SURGERY  05/2004   tendon repair    Objective:   Vitals: BP 127/84 (BP Location: Right Arm)   Pulse 85   Temp 98.8 F (37.1 C) (Oral)   Resp 18   SpO2 97%   Physical Exam  Constitutional: He is oriented to person, place, and time. He appears well-developed and well-nourished.  HENT:  Mouth/Throat: Oropharynx is clear and moist.  Eyes: Pupils are equal, round, and reactive to light. EOM are normal. Right eye exhibits no discharge. Left eye exhibits no discharge. No scleral icterus.  Cardiovascular: Normal rate, regular rhythm and intact distal pulses. Exam  reveals no gallop and no friction rub.  No murmur heard. Pulmonary/Chest: Effort normal. No respiratory distress. He has no wheezes. He has no rales.  Neurological: He is alert and oriented to person, place, and time.  Skin: Skin is warm and dry. Rash noted.  Patches of urticarial type lesions and excoriations over dorsal aspect of his hands and forearms.  He has erythema of the foreskin of his penis with pruritus.  Also has pruritus of his eyelids bilaterally with associated eye swelling and erythema.   Assessment and Plan :   Allergic contact dermatitis due to plants, except food  Itching  Will start a 15-day course of prednisone to address contact dermatitis due to plant.  Use Vistaril for itching.  Counseled patient on potential for adverse effects with medications prescribed today, patient verbalized understanding. Return to clinic precautions discussed.     Jaynee Eagles, PA-C 11/06/17 1743

## 2017-11-06 NOTE — Discharge Instructions (Addendum)
Prednisone Day 1-5: Take 6 tablets daily. Day 6-10: Take 4 tablets daily. Day 11-15: Take 2 tablets daily. Take tablets with breakfast.

## 2018-01-05 ENCOUNTER — Telehealth: Payer: Self-pay | Admitting: Family Medicine

## 2018-01-05 NOTE — Telephone Encounter (Signed)
Called pt to let him know we received PA for his RX and he would need to get in touch with his pharmacy.

## 2018-06-25 ENCOUNTER — Other Ambulatory Visit: Payer: Self-pay | Admitting: Family Medicine

## 2018-06-25 DIAGNOSIS — N529 Male erectile dysfunction, unspecified: Secondary | ICD-10-CM

## 2018-06-25 DIAGNOSIS — E291 Testicular hypofunction: Secondary | ICD-10-CM

## 2018-06-26 NOTE — Telephone Encounter (Signed)
Requested medication (s) are due for refill today: yes  Requested medication (s) are on the active medication list: yes  Last refill:  Testosterone 1% LR 11/04/17 and Sildenafil 100 mg tab LR 12/13/16 with refills  Future visit scheduled: no  Notes to clinic:  Testosterone is off protocol and the sildenafil prescription has expired.   Requested Prescriptions  Pending Prescriptions Disp Refills   testosterone (ANDROGEL) 50 MG/5GM (1%) GEL [Pharmacy Med Name: TESTOSTERONE 1%(50MG ) [id number] UDT] 450 g     Sig: APPLY 5 GM EXTERNALLY TO THE SKIN DAILY     Off-Protocol Failed - 06/25/2018  2:41 PM      Failed - Medication not assigned to a protocol, review manually.      Passed - Valid encounter within last 12 months    Recent Outpatient Visits          7 months ago Hypogonadism in male   [facility], Ranell Patrick, MD   11 months ago Annual physical exam   [facility], Ranell Patrick, MD   11 months ago Hypogonadism male   [facility], Ranell Patrick, MD   1 year ago Abdominal pain, LLQ   [facility], Lanelle Bal, DO   1 year ago Annual physical exam   [facility], Ranell Patrick, MD            sildenafil (VIAGRA) 100 MG tablet [Pharmacy Med Name: SILDENAFIL 100MG  TABLETS] 5 tablet 11    Sig: TAKE 1/2 TO 1 TABLET BY MOUTH ONCE DAILY IF NEEDED     Urology: Erectile Dysfunction Agents Passed - 06/25/2018  2:41 PM      Passed - Last BP in normal range    BP Readings from Last 1 Encounters:  11/06/17 127/84         Passed - Valid encounter within last 12 months    Recent Outpatient Visits          7 months ago Hypogonadism in male   [facility], Ranell Patrick, MD   11 months ago Annual physical exam   [facility] [place], MD   11 months ago Hypogonadism male   [facility], Ranell Patrick, MD   1 year ago Abdominal pain, LLQ   [facility], Lanelle Bal, DO   1 year ago Annual physical exam   [facility], Ranell Patrick, MD

## 2018-06-26 NOTE — Telephone Encounter (Signed)
pls see note 

## 2018-06-28 NOTE — Telephone Encounter (Signed)
He is due for follow-up, 41-month follow-up discussed in June 2019.  I do not see an appointment on the schedule.  I did temporarily refill the testosterone and sildenafil, but please schedule office visit within the next month.

## 2018-07-29 ENCOUNTER — Other Ambulatory Visit: Payer: Self-pay | Admitting: Family Medicine

## 2018-07-29 DIAGNOSIS — E291 Testicular hypofunction: Secondary | ICD-10-CM

## 2018-07-31 NOTE — Telephone Encounter (Signed)
Requested medication (s) are due for refill today:   Yes  Requested medication (s) are on the active medication list:   Yes  Future visit scheduled:   Yes 08/18/2018 with Dr. Carlota Raspberry   Last ordered: 06/28/2018 150 g  0 refills   LOV 11/01/17    Sent to provider for review for refill due to having levels checked.  Requested Prescriptions  Pending Prescriptions Disp Refills   testosterone (ANDROGEL) 50 MG/5GM (1%) GEL [Pharmacy Med Name: TESTOSTERONE 1%(50MG ) [id number] UDT] 150 g     Sig: APPLY 5 GRAMS EXTERNALLY TO THE SKIN DAILY     Off-Protocol Failed - 07/29/2018 10:20 AM      Failed - Medication not assigned to a protocol, review manually.      Passed - Valid encounter within last 12 months    Recent Outpatient Visits          8 months ago Hypogonadism in male   [facility], Ranell Patrick, MD   1 year ago Annual physical exam   [facility], Ranell Patrick, MD   1 year ago Hypogonadism male   [facility], Ranell Patrick, MD   1 year ago Abdominal pain, LLQ   [facility], Lanelle Bal, DO   1 year ago Annual physical exam   [facility], Ranell Patrick, MD      Future Appointments            In 2 weeks Carlota Raspberry Ranell Patrick, MD [facility] [place], [facility]

## 2018-08-02 NOTE — Telephone Encounter (Signed)
Refilled, but must keep upcoming scheduled appointment.

## 2018-08-12 ENCOUNTER — Other Ambulatory Visit: Payer: Self-pay

## 2018-08-12 DIAGNOSIS — Z0000 Encounter for general adult medical examination without abnormal findings: Secondary | ICD-10-CM

## 2018-08-15 ENCOUNTER — Ambulatory Visit (INDEPENDENT_AMBULATORY_CARE_PROVIDER_SITE_OTHER): Payer: 59 | Admitting: Family Medicine

## 2018-08-15 ENCOUNTER — Other Ambulatory Visit: Payer: Self-pay

## 2018-08-15 DIAGNOSIS — Z0000 Encounter for general adult medical examination without abnormal findings: Secondary | ICD-10-CM

## 2018-08-15 DIAGNOSIS — E291 Testicular hypofunction: Secondary | ICD-10-CM

## 2018-08-15 DIAGNOSIS — Z130 Encounter for screening for diseases of the blood and blood-forming organs and certain disorders involving the immune mechanism: Secondary | ICD-10-CM

## 2018-08-15 NOTE — Progress Notes (Signed)
Nurse visit for labs only

## 2018-08-16 LAB — COMPREHENSIVE METABOLIC PANEL
ALT: 44 IU/L (ref 0–44)
AST: 36 IU/L (ref 0–40)
Albumin/Globulin Ratio: 1.8 (ref 1.2–2.2)
Albumin: 4.6 g/dL (ref 4.0–5.0)
Alkaline Phosphatase: 51 IU/L (ref 39–117)
BUN/Creatinine Ratio: 9 (ref 9–20)
BUN: 10 mg/dL (ref 6–20)
Bilirubin Total: 0.5 mg/dL (ref 0.0–1.2)
CO2: 23 mmol/L (ref 20–29)
CREATININE: 1.17 mg/dL (ref 0.76–1.27)
Calcium: 9.7 mg/dL (ref 8.7–10.2)
Chloride: 102 mmol/L (ref 96–106)
GFR calc Af Amer: 94 mL/min/{1.73_m2} (ref >59)
GFR calc non Af Amer: 81 mL/min/{1.73_m2} (ref >59)
Globulin, Total: 2.5 g/dL (ref 1.5–4.5)
Glucose: 89 mg/dL (ref 65–99)
Potassium: 4.6 mmol/L (ref 3.5–5.2)
Sodium: 142 mmol/L (ref 134–144)
Total Protein: 7.1 g/dL (ref 6.0–8.5)

## 2018-08-16 LAB — LIPID PANEL
Chol/HDL Ratio: 3 ratio (ref 0.0–5.0)
Cholesterol, Total: 171 mg/dL (ref 100–199)
HDL: 57 mg/dL (ref >39)
LDL Calculated: 100 mg/dL — ABNORMAL HIGH (ref 0–99)
Triglycerides: 70 mg/dL (ref 0–149)
VLDL CHOLESTEROL CAL: 14 mg/dL (ref 5–40)

## 2018-08-16 LAB — PSA: PROSTATE SPECIFIC AG, SERUM: 0.6 ng/mL (ref 0.0–4.0)

## 2018-08-16 LAB — TSH: TSH: 2.19 u[IU]/mL (ref 0.450–4.500)

## 2018-08-18 ENCOUNTER — Other Ambulatory Visit: Payer: Self-pay

## 2018-08-18 ENCOUNTER — Telehealth (INDEPENDENT_AMBULATORY_CARE_PROVIDER_SITE_OTHER): Payer: 59 | Admitting: Family Medicine

## 2018-08-18 ENCOUNTER — Other Ambulatory Visit: Payer: Self-pay | Admitting: *Deleted

## 2018-08-18 DIAGNOSIS — E291 Testicular hypofunction: Secondary | ICD-10-CM

## 2018-08-18 DIAGNOSIS — N529 Male erectile dysfunction, unspecified: Secondary | ICD-10-CM

## 2018-08-18 DIAGNOSIS — Z130 Encounter for screening for diseases of the blood and blood-forming organs and certain disorders involving the immune mechanism: Secondary | ICD-10-CM

## 2018-08-18 DIAGNOSIS — Z0000 Encounter for general adult medical examination without abnormal findings: Secondary | ICD-10-CM

## 2018-08-18 LAB — CBC
Hematocrit: 45 % (ref 37.5–51.0)
Hemoglobin: 15.2 g/dL (ref 13.0–17.7)
MCH: 30.3 pg (ref 26.6–33.0)
MCHC: 33.8 g/dL (ref 31.5–35.7)
MCV: 90 fL (ref 79–97)
Platelets: 344 10*3/uL (ref 150–450)
RBC: 5.02 x10E6/uL (ref 4.14–5.80)
RDW: 12.2 % (ref 11.6–15.4)
WBC: 7.5 10*3/uL (ref 3.4–10.8)

## 2018-08-18 MED ORDER — TESTOSTERONE 50 MG/5GM (1%) TD GEL: TRANSDERMAL | 5 refills | Status: DC

## 2018-08-18 NOTE — Addendum Note (Signed)
Addended by: Burnis Kingfisher on: 08/18/2018 10:30 AM   Modules accepted: Orders

## 2018-08-18 NOTE — Progress Notes (Signed)
RFV- annual physical-Spoke to patient is doing well. No problems at this time. He came on the 3/24 for lab work labs looks good.

## 2018-08-18 NOTE — Addendum Note (Signed)
Addended by: Burnis Kingfisher on: 08/18/2018 10:32 AM   Modules accepted: Orders

## 2018-08-18 NOTE — Progress Notes (Signed)
Virtual Visit via Telephone Note  I connected with [name] on 08/18/18 at 8:15 AM by telephone and verified that I am speaking with the correct person using two identifiers.   I discussed the limitations, risks, security and privacy concerns of performing an evaluation and management service by telephone and the availability of in person appointments. I also discussed with the patient that there may be a patient responsible charge related to this service. The patient expressed understanding and agreed to proceed, consent obtained  Chief complaint:annual exam, med review.    History of Present Illness:  Hypogonadism: Seen in June 2019, plan for six-month follow-up at that time.  Testosterone level was normal at 402 with free testosterone 10.8.  5 g of AndroGel 1 %/day.  Viagra as needed for erectile dysfunction. Still using androgel. No skin irritation.   Erectile dysfunction: Has viagra- slight HA if not enough water.  No blue vision, hearing changes, chest pain.   STI screening - declines.   Hx of Afib: Plans to see cardiologist for ongoing care - last seen in 2018.  Sebaceous cyst on neck - noted in 2013. Annoyance, now that has lost weight notices more. No d/c, redness, worsening.    Immunization History  Administered Date(s) Administered  . Influenza-Unspecified 03/14/2015  . Tdap 09/30/2014  flu shot in 01/2018 at [facility].   Depression screen [facility] 2/9 08/18/2018 11/04/2017 07/21/2017 07/15/2017 03/19/2017  Decreased Interest 0 0 0 0 0  Down, Depressed, Hopeless 0 0 0 0 0  PHQ - 2 Score 0 0 0 0 0   Dental: prior ongoing care, has to reschedule appointment.   Exercise:  5-6 days per week.  Down to 187 - lost about 35 # from 224. had weight loss challenge.  Meal delivery and cooking at home - gluten free for the most part.    Patient Active Problem List   Diagnosis Date Noted  . Hypogonadism in male 08/18/2018  . Abdominal pain, LLQ 03/19/2017  . Diarrhea  of presumed infectious origin 03/19/2017  . BRBPR (bright red blood per rectum) 03/19/2017  . Paroxysmal atrial fibrillation ([place]) 12/27/2012   Past Medical History:  Diagnosis Date  . A-fib ([place])   . Heart murmur   . Mitral valve prolapse   . Paroxysmal atrial fibrillation ([facility])   . Testosterone deficiency    Past Surgical History:  Procedure Laterality Date  . HIP SURGERY    . NM MYOCAR PERF WALL MOTION  12/11/2004   [place], MD: no ischemia  . SURGERY SCROTAL / TESTICULAR  06/1994   torsion  . US ECHOCARDIOGRAPHY  10/06/2010   [place] mildly dilated,trace MR  . WRIST SURGERY  05/2004   tendon repair   No Known Allergies Prior to Admission medications   Medication Sig Start Date End Date Taking? Authorizing Provider  hydrOXYzine (ATARAX/VISTARIL) 25 MG tablet Take 1-2 tablets (25-50 mg total) by mouth 2 (two) times daily as needed for itching. 11/06/17  Yes Jaynee Eagles, PA-C  sildenafil (VIAGRA) 100 MG tablet TAKE 1/2 TO 1 TABLET BY MOUTH ONCE DAILY IF NEEDED 06/28/18  Yes Wendie Agreste, MD  testosterone (ANDROGEL) 50 MG/5GM (1%) GEL APPLY 5 GRAMS EXTERNALLY TO THE SKIN DAILY 08/02/18  Yes Wendie Agreste, MD   Social History   Socioeconomic History  . Marital status: Single    Spouse name: Not on file  . Number of children: Not on file  . Years of education: Not on file  . Highest  education level: Not on file  Occupational History  . Not on file  Social Needs  . Financial resource strain: Not on file  . Food insecurity:    Worry: Not on file    Inability: Not on file  . Transportation needs:    Medical: Not on file    Non-medical: Not on file  Tobacco Use  . Smoking status: Former Smoker    Types: Cigars  . Smokeless tobacco: Never Used  Substance and Sexual Activity  . Alcohol use: Yes    Comment: social  . Drug use: No  . Sexual activity: Yes    Birth control/protection: None  Lifestyle  . Physical activity:    Days per week: Not on file    Minutes per  session: Not on file  . Stress: Not on file  Relationships  . Social connections:    Talks on phone: Not on file    Gets together: Not on file    Attends religious service: Not on file    Active member of club or organization: Not on file    Attends meetings of clubs or organizations: Not on file    Relationship status: Not on file  . Intimate partner violence:    Fear of current or ex partner: Not on file    Emotionally abused: Not on file    Physically abused: Not on file    Forced sexual activity: Not on file  Other Topics Concern  . Not on file  Social History Narrative  . Not on file     Observations/Objective: No distress on phone.   Assessment and Plan: Annual physical exam  - -anticipatory guidance as below in AVS, screening labs above. Health maintenance items as above in HPI discussed/recommended as applicable.   -commended on weight loss. Labs look better (lipids, lfts).  Hypogonadism in male  - check levels, monitoring labs improved.   Erectile dysfunction, unspecified erectile dysfunction type  - viagra Rx refilled prior. risks discussed prior. Tolerating current dose.   Plan to follow-up after COVID-19 pandemic to look at sebaceous cyst, but if any acute worsening return sooner.  Plans follow-up with cardiology regarding A. fib.  Should not need referral, but can place if needed.   Follow Up Instructions: 6 months   I discussed the assessment and treatment plan with the patient. The patient was provided an opportunity to ask questions and all were answered. The patient agreed with the plan and demonstrated an understanding of the instructions.   The patient was advised to call back or seek an in-person evaluation if the symptoms worsen or if the condition fails to improve as anticipated.  I provided 11 minutes of non-face-to-face time during this encounter.  Signed,   [name], MD [facility].   08/18/18

## 2018-08-18 NOTE — Patient Instructions (Addendum)
Good job and congratulations on the weight loss.  Keep up the good work.  I will see if the lab can add the other blood work as we discussed.  No change in meds for now.  Let me know if you need referral to cardiology or other questions.  Take care  Keeping you healthy  Get these tests  Blood pressure- Have your blood pressure checked once a year by your healthcare provider.  Normal blood pressure is 120/80.  Weight- Have your body mass index (BMI) calculated to screen for obesity.  BMI is a measure of body fat based on height and weight. You can also calculate your own BMI at GravelBags.it.  Cholesterol- Have your cholesterol checked regularly starting at age 34, sooner may be necessary if you have diabetes, high blood pressure, if a family member developed heart diseases at an early age or if you smoke.   Chlamydia, HIV, and other sexual transmitted disease- Get screened each year until the age of 19 then within three months of each new sexual partner.  Diabetes- Have your blood sugar checked regularly if you have high blood pressure, high cholesterol, a family history of diabetes or if you are overweight.  Get these vaccines  Flu shot- Every fall.  Tetanus shot- Every 10 years.  Menactra- Single dose; prevents meningitis.  Take these steps  Don't smoke- If you do smoke, ask your healthcare provider about quitting. For tips on how to quit, go to www.smokefree.gov or call 1-800-QUIT-NOW.  Be physically active- Exercise 5 days a week for at least 30 minutes.  If you are not already physically active start slow and gradually work up to 30 minutes of moderate physical activity.  Examples of moderate activity include walking briskly, mowing the yard, dancing, swimming bicycling, etc.  Eat a healthy diet- Eat a variety of healthy foods such as fruits, vegetables, low fat milk, low fat cheese, yogurt, lean meats, poultry, fish, beans, tofu, etc.  For more information on  healthy eating, go to www.thenutritionsource.org  Drink alcohol in moderation- Limit alcohol intake two drinks or less a day.  Never drink and drive.  Dentist- Brush and floss teeth twice daily; visit your dentis twice a year.  Depression-Your emotional health is as important as your physical health.  If you're feeling down, losing interest in things you normally enjoy please talk with your healthcare provider.  Gun Safety- If you keep a gun in your home, keep it unloaded and with the safety lock on.  Bullets should be stored separately.  Helmet use- Always wear a helmet when riding a motorcycle, bicycle, rollerblading or skateboarding.  Safe sex- If you may be exposed to a sexually transmitted infection, use a condom  Seat belts- Seat bels can save your life; always wear one.  Smoke/Carbon Monoxide detectors- These detectors need to be installed on the appropriate level of your home.  Replace batteries at least once a year.  Skin Cancer- When out in the sun, cover up and use sunscreen SPF 15 or higher.  Violence- If anyone is threatening or hurting you, please tell your healthcare provider.    If you have lab work done today you will be contacted with your lab results within the next 2 weeks.  If you have not heard from [place] then please contact us. The fastest way to get your results is to register for My Chart.   IF you received an x-ray today, you will receive an invoice from [facility]. Please contact  [facility] at [phone] with questions or concerns regarding your invoice.   IF you received labwork today, you will receive an invoice from [facility]. Please contact [facility] at [phone] with questions or concerns regarding your invoice.   Our billing staff will not be able to assist you with questions regarding bills from these companies.  You will be contacted with the lab results as soon as they are available. The fastest way to get your results is to  activate your My Chart account. Instructions are located on the last page of this paperwork. If you have not heard from [place] regarding the results in 2 weeks, please contact this office.

## 2018-08-18 NOTE — Addendum Note (Signed)
Addended by: Burnis Kingfisher on: 08/18/2018 10:24 AM   Modules accepted: Orders

## 2019-05-09 IMAGING — DX DG ABDOMEN 2V
3 series · 3 of 3 positions shown · non-contrast
Comparison: None.

CLINICAL DATA: Left lower quadrant abdominal pain for 2 weeks.

EXAM:
ABDOMEN - 2 VIEW

[abdomen erect (1 of 2)]
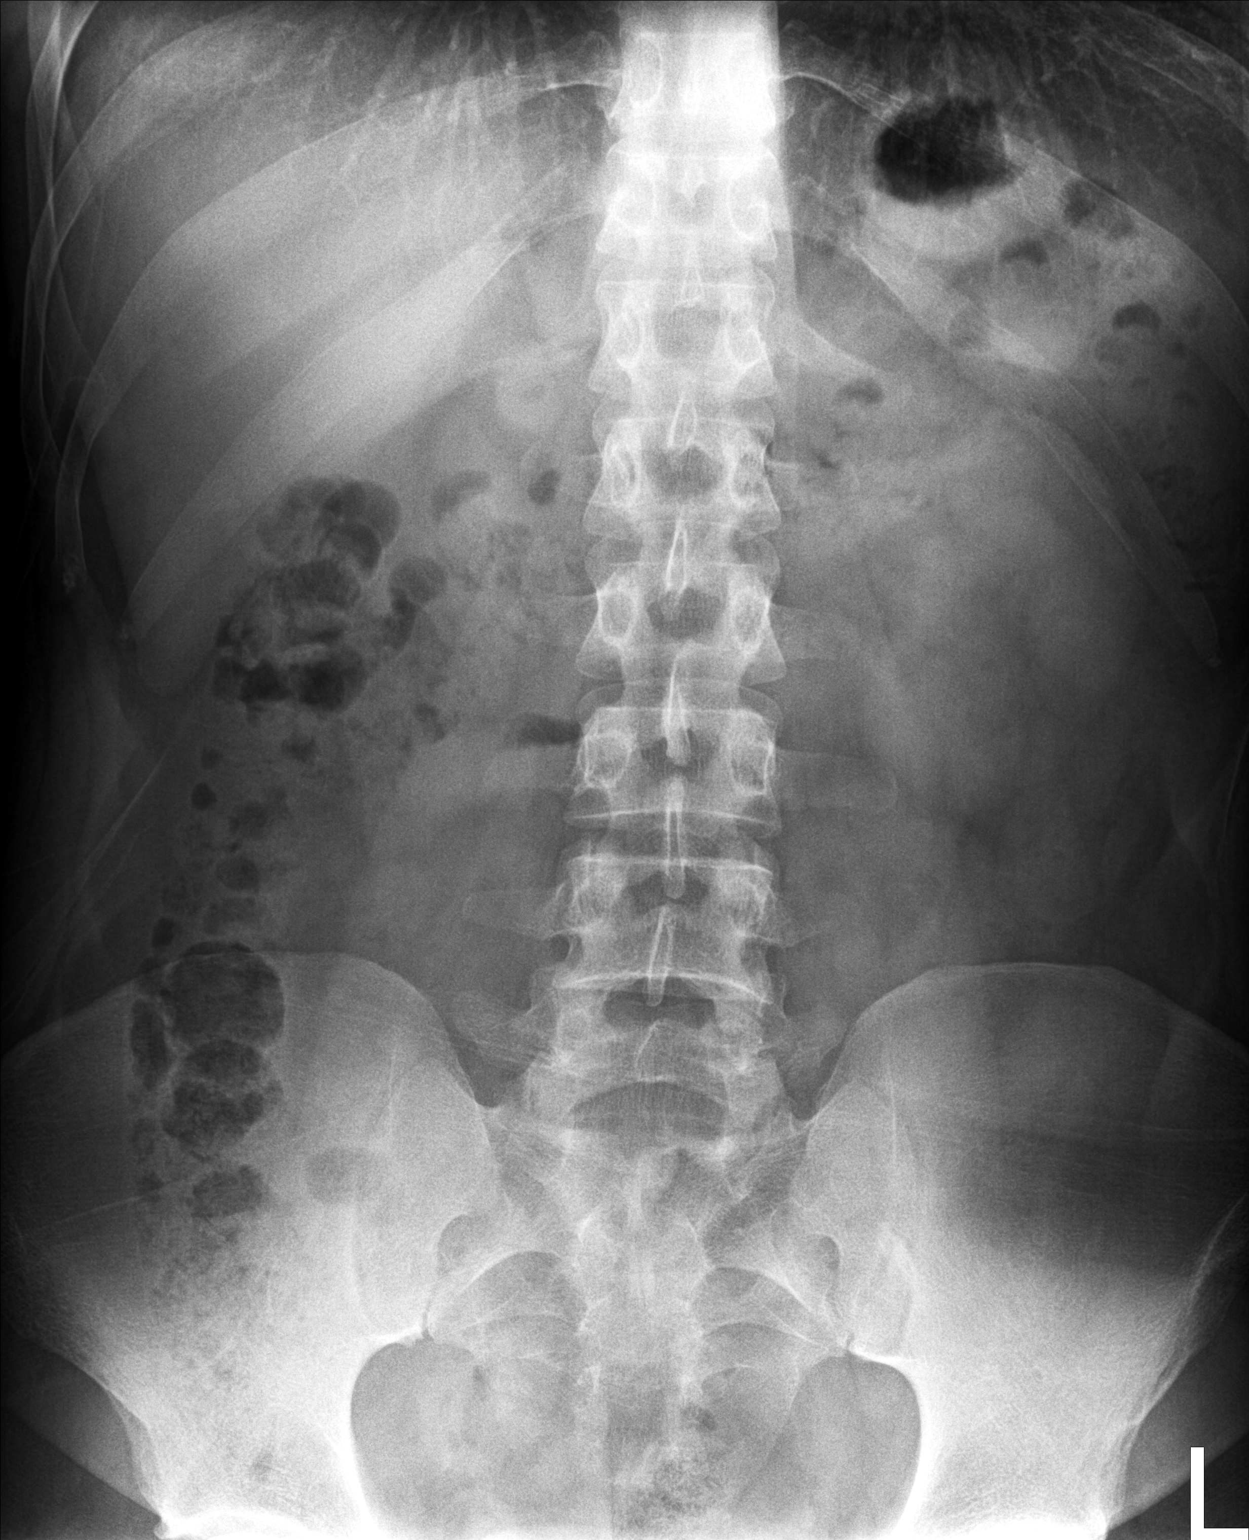

[abdomen supine]
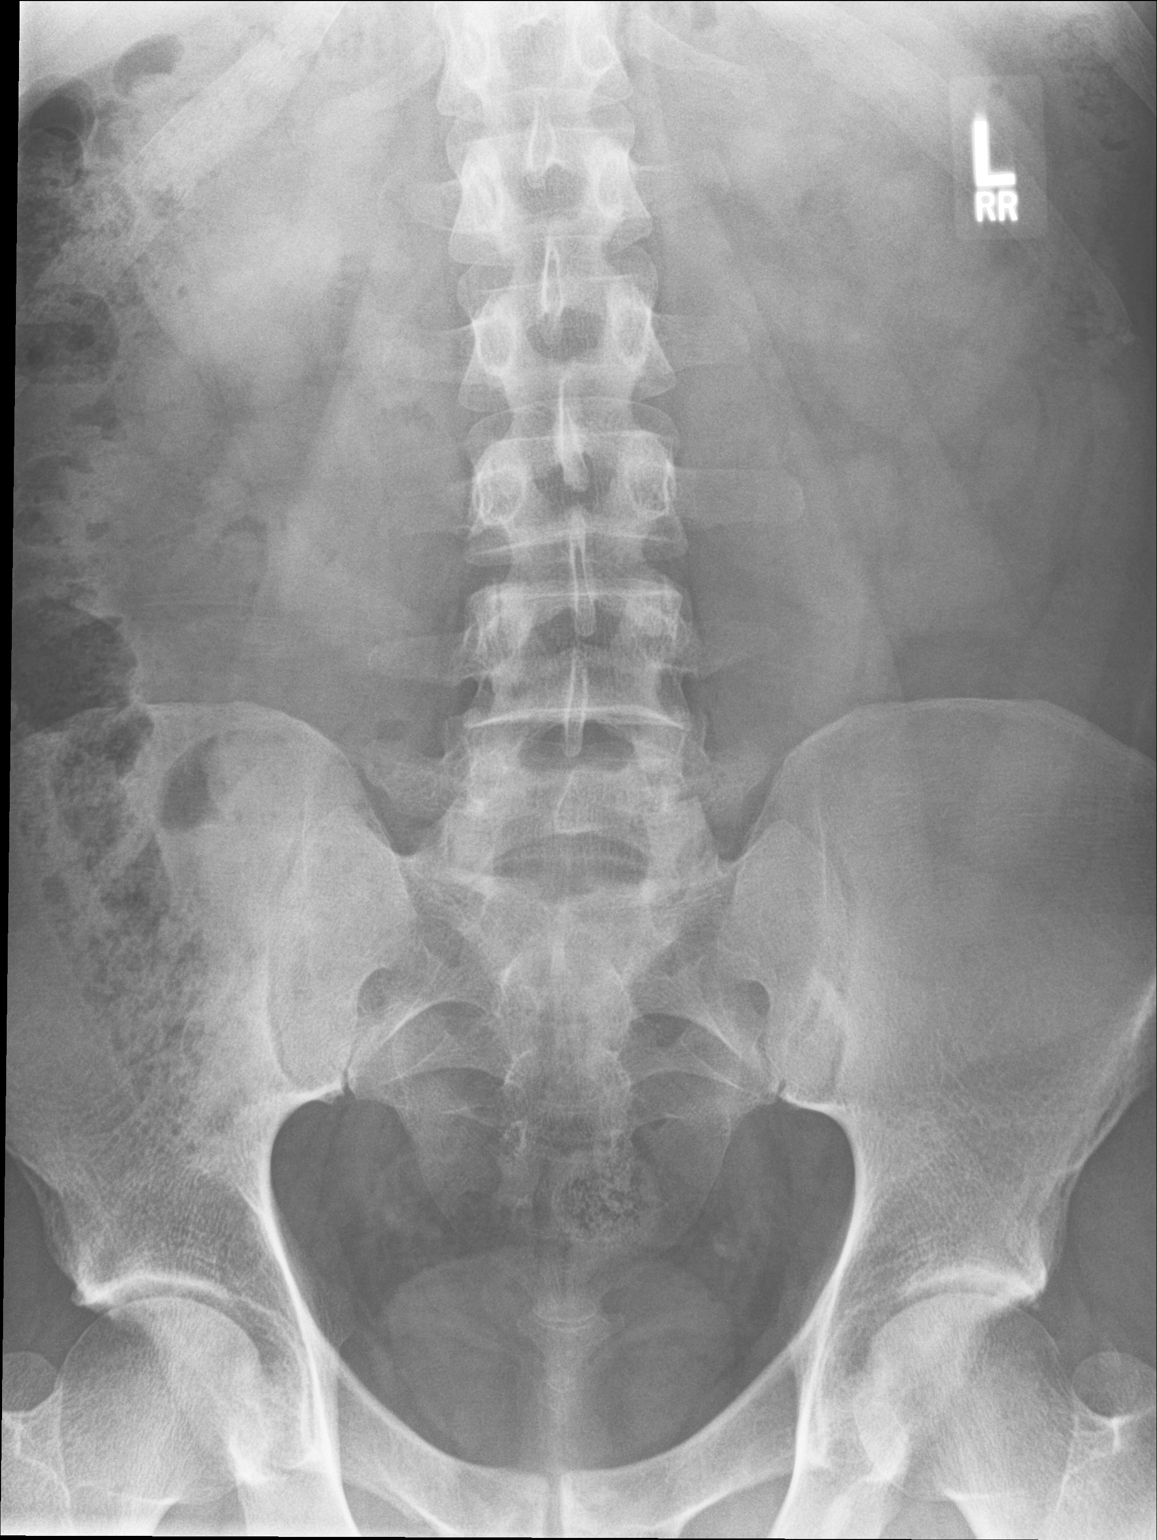

[abdomen erect (2 of 2)]
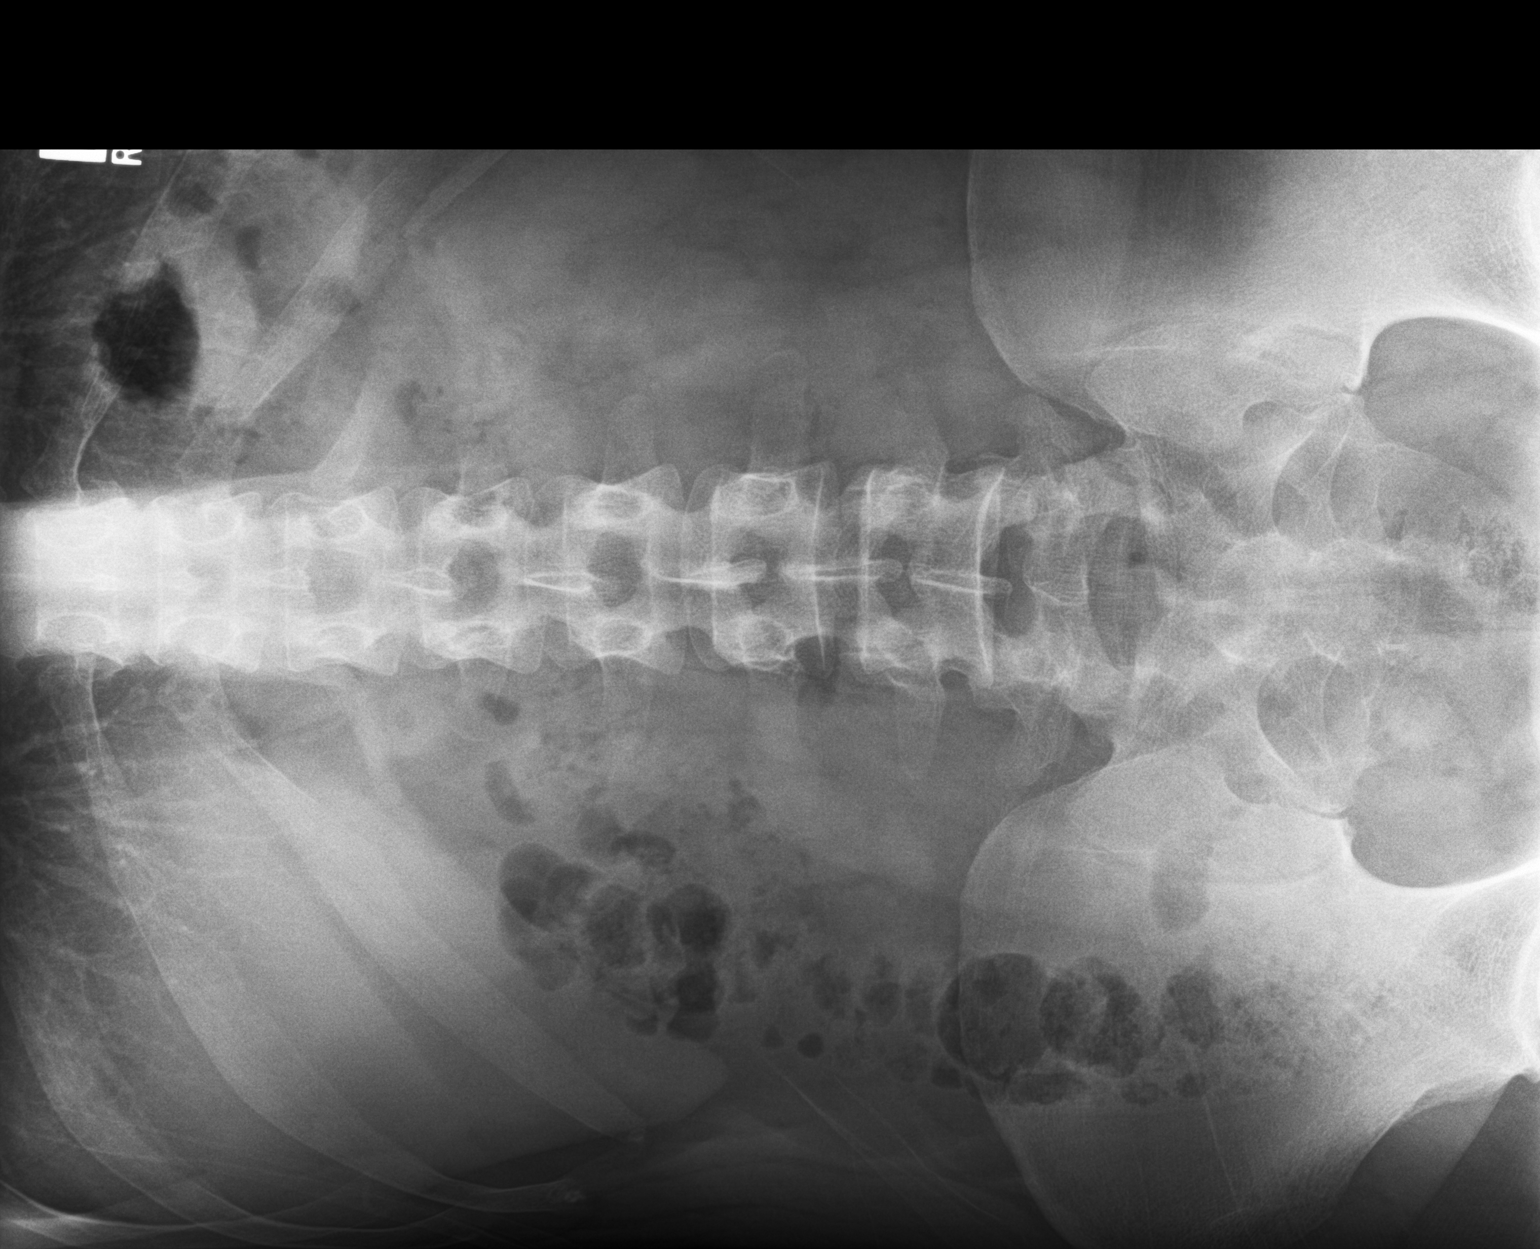

[3 of 3 positions shown; findings below may reference images not displayed]

FINDINGS: The bowel gas pattern is normal. There is no evidence of free air.
No radio-opaque calculi or other significant radiographic
abnormality is seen.
IMPRESSION: Negative.

## 2019-08-19 ENCOUNTER — Encounter: Payer: Self-pay | Admitting: Family Medicine

## 2019-08-19 DIAGNOSIS — N529 Male erectile dysfunction, unspecified: Secondary | ICD-10-CM

## 2019-08-20 MED ORDER — SILDENAFIL CITRATE 100 MG PO TABS: ORAL_TABLET | ORAL | 1 refills | Status: DC

## 2019-09-30 ENCOUNTER — Encounter (HOSPITAL_COMMUNITY): Payer: Self-pay

## 2019-09-30 ENCOUNTER — Other Ambulatory Visit: Payer: Self-pay

## 2019-09-30 ENCOUNTER — Ambulatory Visit (HOSPITAL_COMMUNITY)
Admission: EM | Admit: 2019-09-30 | Discharge: 2019-09-30 | Disposition: A | Discharge status: Home / Self Care | Payer: 59 | Attending: Urgent Care | Admitting: Urgent Care

## 2019-09-30 DIAGNOSIS — M542 Cervicalgia: Secondary | ICD-10-CM

## 2019-09-30 DIAGNOSIS — L720 Epidermal cyst: Secondary | ICD-10-CM

## 2019-09-30 DIAGNOSIS — L089 Local infection of the skin and subcutaneous tissue, unspecified: Secondary | ICD-10-CM

## 2019-09-30 MED ORDER — NAPROXEN 500 MG PO TABS: 500.0000 mg | ORAL_TABLET | Freq: Two times a day (BID) | ORAL | 0 refills | Status: DC

## 2019-09-30 MED ORDER — DOXYCYCLINE HYCLATE 100 MG PO CAPS: 100.0000 mg | ORAL_CAPSULE | Freq: Two times a day (BID) | ORAL | 0 refills | Status: DC

## 2019-09-30 NOTE — ED Provider Notes (Signed)
[place]   MRN: [id number] DOB: 07/31/1985  Subjective:   [name] is a 34 y.o. male presenting for 1 year history of worsening mass over the back of his neck.  In the past week has become more painful and swollen.  No current facility-administered medications for this encounter.  Current Outpatient Medications:  .  hydrOXYzine (ATARAX/VISTARIL) 25 MG tablet, Take 1-2 tablets (25-50 mg total) by mouth 2 (two) times daily as needed for itching., Disp: 30 tablet, Rfl: 0 .  sildenafil (VIAGRA) 100 MG tablet, TAKE 1/2 TO 1 TABLET BY MOUTH ONCE DAILY IF NEEDED, Disp: 5 tablet, Rfl: 1 .  testosterone (ANDROGEL) 50 MG/5GM (1%) GEL, APPLY 5 GRAMS EXTERNALLY TO THE SKIN DAILY, Disp: 150 g, Rfl: 5   No Known Allergies  Past Medical History:  Diagnosis Date  . A-fib ([place])   . Heart murmur   . Mitral valve prolapse   . Paroxysmal atrial fibrillation ([facility])   . Testosterone deficiency      Past Surgical History:  Procedure Laterality Date  . HIP SURGERY    . NM MYOCAR PERF WALL MOTION  12/11/2004   [place], MD: no ischemia  . SURGERY SCROTAL / TESTICULAR  06/1994   torsion  . US ECHOCARDIOGRAPHY  10/06/2010   [place] mildly dilated,trace MR  . WRIST SURGERY  05/2004   tendon repair    Family History  Problem Relation Age of Onset  . Cancer Mother   . Diabetes Father   . Hypertension Father   . Hyperlipidemia Father   . Cancer Maternal Grandmother   . Cancer Paternal Grandmother   . Cancer Paternal Grandfather     Social History   Tobacco Use  . Smoking status: Former Smoker    Types: Cigars  . Smokeless tobacco: Never Used  Substance Use Topics  . Alcohol use: Yes    Comment: social  . Drug use: No    ROS   Objective:   Vitals: BP 137/86 (BP Location: Right Arm)   Pulse 68   Temp 98.4 F (36.9 C) (Oral)   Resp 18   Wt 227 lb 12.8 oz (103.3 kg)   SpO2 98%   BMI 30.05 kg/m   Physical Exam Constitutional:      General: He is not in acute  distress.    Appearance: Normal appearance. He is well-developed and normal weight. He is not ill-appearing, toxic-appearing or diaphoretic.  HENT:     Head: Normocephalic and atraumatic.     Right Ear: External ear normal.     Left Ear: External ear normal.     Nose: Nose normal.     Mouth/Throat:     Pharynx: Oropharynx is clear.  Eyes:     General: No scleral icterus.       Right eye: No discharge.        Left eye: No discharge.     Extraocular Movements: Extraocular movements intact.     Pupils: Pupils are equal, round, and reactive to light.  Neck:   Cardiovascular:     Rate and Rhythm: Normal rate.  Pulmonary:     Effort: Pulmonary effort is normal.  Musculoskeletal:     Cervical back: Normal range of motion.  Neurological:     Mental Status: He is alert and oriented to person, place, and time.  Psychiatric:        Mood and Affect: Mood normal.        Behavior: Behavior normal.  Thought Content: Thought content normal.        Judgment: Judgment normal.     PROCEDURE NOTE: I&D of Abscess Verbal consent obtained. Local anesthesia with 5cc of 2% lidocaine with epinephrine. Site cleansed with Betadine. Incision of 1cm was made using a 11 blade, cyst removed in its entirety with Adson forceps, hemostats. Wound cavity was explored with curved hemostats and packed with 1/2" plain packing. Cleansed and dressed.   Assessment and Plan :   PDMP not reviewed this encounter.  1. Neck pain   2. Infected epidermoid cyst     Cyst successfully removed, start doxycycline for infected cyst. Use naproxen for pain and inflammation. Counseled patient on potential for adverse effects with medications prescribed/recommended today, ER and return-to-clinic precautions discussed, patient verbalized understanding.    Jaynee Eagles, PA-C 10/04/19 1009

## 2019-09-30 NOTE — Discharge Instructions (Signed)
Please change your dressing 2-3 times daily. Do not apply any ointments or creams. Each time you change your dressing, make sure you clean gently around the perimeter of the wound with gentle soap and warm water. Pat your wound dry and let it air before reapplying another dressing. If the dressing soaks through, then reapply another one.

## 2019-09-30 NOTE — ED Triage Notes (Signed)
Pt states he has a abscess on the back of his neck.

## 2019-10-03 ENCOUNTER — Encounter (HOSPITAL_COMMUNITY): Payer: Self-pay

## 2019-10-03 ENCOUNTER — Other Ambulatory Visit: Payer: Self-pay

## 2019-10-03 ENCOUNTER — Ambulatory Visit (HOSPITAL_COMMUNITY)
Admission: EM | Admit: 2019-10-03 | Discharge: 2019-10-03 | Disposition: A | Discharge status: Home / Self Care | Payer: 59 | Attending: Family Medicine | Admitting: Family Medicine

## 2019-10-03 DIAGNOSIS — Z5189 Encounter for other specified aftercare: Secondary | ICD-10-CM

## 2019-10-03 NOTE — ED Provider Notes (Signed)
[place]    CSN: [id number] Arrival date & time: 10/03/19  1344      History   Chief Complaint Chief Complaint  Patient presents with  . Wound Check    HPI [name] is a 34 y.o. male.   Patient is a 34 year old male with past medical history of A. fib, heart murmur, mitral valve prolapse.  He presents today for wound check.  Wound located to posterior neck area.  Was seen here few days prior and had I&D performed with packing placed.  Was told to return here for recheck.  He is currently denying any pain.  He still has some mild drainage from the area.  Is currently still taking the antibiotics that were prescribed.  No  fever, chills, body aches or night sweats.  ROS per HPI      Past Medical History:  Diagnosis Date  . A-fib ([place])   . Heart murmur   . Mitral valve prolapse   . Paroxysmal atrial fibrillation (H[facility])   . Testosterone deficiency     Patient Active Problem List   Diagnosis Date Noted  . Hypogonadism in male 08/18/2018  . Abdominal pain, LLQ 03/19/2017  . Diarrhea of presumed infectious origin 03/19/2017  . BRBPR (bright red blood per rectum) 03/19/2017  . Paroxysmal atrial fibrillation ([place]) 12/27/2012    Past Surgical History:  Procedure Laterality Date  . HIP SURGERY    . NM MYOCAR PERF WALL MOTION  12/11/2004   [place], MD: no ischemia  . SURGERY SCROTAL / TESTICULAR  06/1994   torsion  . US ECHOCARDIOGRAPHY  10/06/2010   [place] mildly dilated,trace MR  . WRIST SURGERY  05/2004   tendon repair       Home Medications    Prior to Admission medications   Medication Sig Start Date End Date Taking? Authorizing Provider  doxycycline (VIBRAMYCIN) 100 MG capsule Take 1 capsule (100 mg total) by mouth 2 (two) times daily. 09/30/19   Jaynee Eagles, PA-C  hydrOXYzine (ATARAX/VISTARIL) 25 MG tablet Take 1-2 tablets (25-50 mg total) by mouth 2 (two) times daily as needed for itching. 11/06/17   Jaynee Eagles, PA-C  naproxen (NAPROSYN) 500  MG tablet Take 1 tablet (500 mg total) by mouth 2 (two) times daily. 09/30/19   Jaynee Eagles, PA-C  sildenafil (VIAGRA) 100 MG tablet TAKE 1/2 TO 1 TABLET BY MOUTH ONCE DAILY IF NEEDED 08/20/19   Wendie Agreste, MD  testosterone (ANDROGEL) 50 MG/5GM (1%) GEL APPLY 5 GRAMS EXTERNALLY TO THE SKIN DAILY 08/18/18   Wendie Agreste, MD    Family History Family History  Problem Relation Age of Onset  . Cancer Mother   . Diabetes Father   . Hypertension Father   . Hyperlipidemia Father   . Cancer Maternal Grandmother   . Cancer Paternal Grandmother   . Cancer Paternal Grandfather     Social History Social History   Tobacco Use  . Smoking status: Former Smoker    Types: Cigars  . Smokeless tobacco: Never Used  Substance Use Topics  . Alcohol use: Yes    Comment: social  . Drug use: No     Allergies   Patient has no known allergies.   Review of Systems Review of Systems   Physical Exam Triage Vital Signs ED Triage Vitals [10/03/19 1443]  Enc Vitals Group     BP 121/83     Pulse Rate 67     Resp 17  Temp 98.5 F (36.9 C)     Temp Source Oral     SpO2 100 %     Weight      Height      Head Circumference      Peak Flow      Pain Score 0     Pain Loc      Pain Edu?      Excl. in [place]?    No data found.  Updated Vital Signs BP 121/83 (BP Location: Right Arm)   Pulse 67   Temp 98.5 F (36.9 C) (Oral)   Resp 17   SpO2 100%   Visual Acuity Right Eye Distance:   Left Eye Distance:   Bilateral Distance:    Right Eye Near:   Left Eye Near:    Bilateral Near:     Physical Exam Vitals and nursing note reviewed.  Constitutional:      Appearance: Normal appearance.  HENT:     Head: Normocephalic and atraumatic.     Nose: Nose normal.  Eyes:     Conjunctiva/sclera: Conjunctivae normal.  Neck:     Comments: Abscess to anterior neck with packing in place.  Appears to be healing well Packing removed.  Pulmonary:     Effort: Pulmonary effort is normal.   Musculoskeletal:        General: Normal range of motion.     Cervical back: Normal range of motion.  Skin:    General: Skin is warm and dry.  Neurological:     Mental Status: He is alert.  Psychiatric:        Mood and Affect: Mood normal.      UC Treatments / Results  Labs (all labs ordered are listed, but only abnormal results are displayed) Labs Reviewed - No data to display  EKG   Radiology No results found.  Procedures Procedures (including critical care time)  Medications Ordered in UC Medications - No data to display  Initial Impression / Assessment and Plan / UC Course  I have reviewed the triage vital signs and the nursing notes.  Pertinent labs & imaging results that were available during my care of the patient were reviewed by me and considered in my medical decision making (see chart for details).     Wound check Appears to be healing well Packing removed.  Continue the abx as prescribed.  Follow up as needed for continued or worsening symptoms  Final Clinical Impressions(s) / UC Diagnoses   Final diagnoses:  Visit for wound check     Discharge Instructions     Keep taking the antibiotic as prescribed.  The wound appears to be healing nicely Follow up as needed for continued or worsening symptoms     ED Prescriptions    None     PDMP not reviewed this encounter.   Loura Halt A, NP 10/03/19 1500

## 2019-10-03 NOTE — ED Triage Notes (Signed)
Here for wound check after abscess drainage Sunday.

## 2019-10-03 NOTE — Discharge Instructions (Signed)
Keep taking the antibiotic as prescribed.  The wound appears to be healing nicely Follow up as needed for continued or worsening symptoms

## 2019-11-09 ENCOUNTER — Other Ambulatory Visit: Payer: Self-pay | Admitting: Family Medicine

## 2019-11-09 DIAGNOSIS — N529 Male erectile dysfunction, unspecified: Secondary | ICD-10-CM

## 2019-11-09 NOTE — Telephone Encounter (Signed)
Attempted to call patient to schedule annual exam- due. Left message to call office to schedule. Courtesy RF given

## 2020-01-24 ENCOUNTER — Other Ambulatory Visit: Payer: Self-pay | Admitting: Family Medicine

## 2020-01-24 DIAGNOSIS — N529 Male erectile dysfunction, unspecified: Secondary | ICD-10-CM

## 2020-07-01 ENCOUNTER — Telehealth: Payer: Self-pay | Admitting: Family Medicine

## 2020-07-01 NOTE — Telephone Encounter (Signed)
Please prder fasting labs for cpe 10/08/2020

## 2020-08-08 ENCOUNTER — Ambulatory Visit: Payer: Self-pay | Admitting: Family Medicine

## 2020-09-16 ENCOUNTER — Encounter: Payer: Self-pay | Admitting: Family Medicine

## 2020-10-02 ENCOUNTER — Ambulatory Visit: Payer: Self-pay

## 2020-10-06 ENCOUNTER — Encounter: Payer: Self-pay | Admitting: Family Medicine

## 2020-12-03 ENCOUNTER — Telehealth: Payer: Self-pay

## 2020-12-03 DIAGNOSIS — Z1329 Encounter for screening for other suspected endocrine disorder: Secondary | ICD-10-CM

## 2020-12-03 DIAGNOSIS — Z130 Encounter for screening for diseases of the blood and blood-forming organs and certain disorders involving the immune mechanism: Secondary | ICD-10-CM

## 2020-12-03 DIAGNOSIS — Z13220 Encounter for screening for lipoid disorders: Secondary | ICD-10-CM

## 2020-12-03 DIAGNOSIS — Z5181 Encounter for therapeutic drug level monitoring: Secondary | ICD-10-CM

## 2020-12-03 DIAGNOSIS — Z131 Encounter for screening for diabetes mellitus: Secondary | ICD-10-CM

## 2020-12-03 NOTE — Telephone Encounter (Signed)
Pt is coming in tomorrow afternoon and wants to come in the morning to do blood work is it possible blood work order can be put in the system to schedule him lab work?  Pt call back [phone]

## 2020-12-03 NOTE — Telephone Encounter (Signed)
Orders placed, advised patient if labs desired in am prior to appt.

## 2020-12-04 ENCOUNTER — Other Ambulatory Visit: Payer: Self-pay

## 2020-12-04 ENCOUNTER — Encounter: Payer: Self-pay | Admitting: Family Medicine

## 2020-12-04 ENCOUNTER — Ambulatory Visit (INDEPENDENT_AMBULATORY_CARE_PROVIDER_SITE_OTHER): Payer: 59 | Admitting: Family Medicine

## 2020-12-04 ENCOUNTER — Other Ambulatory Visit (INDEPENDENT_AMBULATORY_CARE_PROVIDER_SITE_OTHER): Payer: 59

## 2020-12-04 VITALS — BP 128/74 | HR 72 | Temp 98.3°F | Resp 17 | Ht 73.0 in | Wt 221.4 lb

## 2020-12-04 DIAGNOSIS — D229 Melanocytic nevi, unspecified: Secondary | ICD-10-CM

## 2020-12-04 DIAGNOSIS — Z131 Encounter for screening for diabetes mellitus: Secondary | ICD-10-CM

## 2020-12-04 DIAGNOSIS — Z0000 Encounter for general adult medical examination without abnormal findings: Secondary | ICD-10-CM | POA: Diagnosis not present

## 2020-12-04 DIAGNOSIS — Z5181 Encounter for therapeutic drug level monitoring: Secondary | ICD-10-CM | POA: Diagnosis not present

## 2020-12-04 DIAGNOSIS — N529 Male erectile dysfunction, unspecified: Secondary | ICD-10-CM | POA: Diagnosis not present

## 2020-12-04 DIAGNOSIS — I480 Paroxysmal atrial fibrillation: Secondary | ICD-10-CM

## 2020-12-04 DIAGNOSIS — Z1329 Encounter for screening for other suspected endocrine disorder: Secondary | ICD-10-CM

## 2020-12-04 DIAGNOSIS — Z13220 Encounter for screening for lipoid disorders: Secondary | ICD-10-CM | POA: Diagnosis not present

## 2020-12-04 DIAGNOSIS — E291 Testicular hypofunction: Secondary | ICD-10-CM

## 2020-12-04 LAB — COMPREHENSIVE METABOLIC PANEL
ALT: 29 U/L (ref 0–53)
AST: 21 U/L (ref 0–37)
Albumin: 4.8 g/dL (ref 3.5–5.2)
Alkaline Phosphatase: 50 U/L (ref 39–117)
BUN: 12 mg/dL (ref 6–23)
CO2: 27 mEq/L (ref 19–32)
Calcium: 9.7 mg/dL (ref 8.4–10.5)
Chloride: 104 mEq/L (ref 96–112)
Creatinine, Ser: 1.12 mg/dL (ref 0.40–1.50)
GFR: 84.65 mL/min (ref >60.00)
Glucose, Bld: 91 mg/dL (ref 70–99)
Potassium: 4.2 mEq/L (ref 3.5–5.1)
Sodium: 139 mEq/L (ref 135–145)
Total Bilirubin: 0.9 mg/dL (ref 0.2–1.2)
Total Protein: 7.4 g/dL (ref 6.0–8.3)

## 2020-12-04 LAB — PSA: PSA: 0.53 ng/mL (ref 0.10–4.00)

## 2020-12-04 LAB — CBC
HCT: 43.4 % (ref 39.0–52.0)
Hemoglobin: 15.1 g/dL (ref 13.0–17.0)
MCHC: 34.7 g/dL (ref 30.0–36.0)
MCV: 89.9 fl (ref 78.0–100.0)
Platelets: 285 10*3/uL (ref 150.0–400.0)
RBC: 4.83 Mil/uL (ref 4.22–5.81)
RDW: 13.7 % (ref 11.5–15.5)
WBC: 7.4 10*3/uL (ref 4.0–10.5)

## 2020-12-04 LAB — LIPID PANEL
Cholesterol: 222 mg/dL — ABNORMAL HIGH (ref 0–200)
HDL: 68.2 mg/dL (ref >39.00)
LDL Cholesterol: 136 mg/dL — ABNORMAL HIGH (ref 0–99)
NonHDL: 153.7
Total CHOL/HDL Ratio: 3
Triglycerides: 87 mg/dL (ref 0.0–149.0)
VLDL: 17.4 mg/dL (ref 0.0–40.0)

## 2020-12-04 LAB — TSH: TSH: 2.46 u[IU]/mL (ref 0.35–5.50)

## 2020-12-04 LAB — TESTOSTERONE: Testosterone: 211.35 ng/dL — ABNORMAL LOW (ref 300.00–890.00)

## 2020-12-04 MED ORDER — SILDENAFIL CITRATE 100 MG PO TABS
ORAL_TABLET | ORAL | 5 refills | Status: AC
Start: 2020-12-04 — End: ?

## 2020-12-04 NOTE — Addendum Note (Signed)
Addended by: Octavio Manns E on: 12/04/2020 04:55 PM   Modules accepted: Orders

## 2020-12-04 NOTE — Patient Instructions (Addendum)
I will refer you to dermatology and cardiology.  Depending on lab work can consider follow-up in the next 3 to 6 months with increased exercise.  Also will check testosterone level to decide if restarting medication is needed.  Viagra refilled if needed, use lowest effective dose.   Let me know if there are questions.  Good luck with the wedding, and follow-up with me in 1 year for physical unless labs indicate visit needed sooner.

## 2020-12-04 NOTE — Progress Notes (Signed)
Subjective:  Patient ID: [name], male    DOB: Feb 26, 1986  Age: 35 y.o. MRN: [id number]  CC:  Chief Complaint  Patient presents with   Annual Exam    Pt reports here for physical today, pt reports hxt afib and requesting to be referred back to cardiology for examination. Pt also requesting referral to dermatology to have moles checked on his skin.     HPI [name] presents for  Annual physical exam. Last visit - virtual in 2020.   Getting married in 2 months. Trying to double check on health prior to wedding.   Paroxysmal atrial fibrillation Last seen by cardiology in 2018 when discussed at his last physical.  [name].  No current meds.  Last flare/palpitations - years ago. Feels well.   Hypogonadism: Treated with AndroGel 1% 50mg /5g/day previous treatment. Off meds for some time- 1-2 years.  Feeling ok. Overall erections doing ok - rare time when viagra may have been needed. Does not want to start back on testosterone at this time. But would like to check levels.  Viagra if needed for erectile dysfunction.  Denies new blue color to the vision/vision change, decreased hearing, or chest pain/dyspnea with exertion. Rare HA if not drinking enough water.   Lab Results  Component Value Date   WBC 7.4 12/04/2020   HGB 15.1 12/04/2020   HCT 43.4 12/04/2020   MCV 89.9 12/04/2020   PLT 285.0 12/04/2020   Lab Results  Component Value Date   PSA1 0.6 08/15/2018   PSA1 0.7 07/21/2017   PSA1 0.6 12/13/2016   PSA 0.53 12/04/2020   PSA 0.74 09/30/2014   PSA 0.57 06/08/2014    Lab Results  Component Value Date   CHOL 222 (H) 12/04/2020   HDL 68.20 12/04/2020   LDLCALC 136 (H) 12/04/2020   TRIG 87.0 12/04/2020   CHOLHDL 3 12/04/2020   Lab Results  Component Value Date   TESTOSTERONE 402 11/04/2017   Cancer screening Requests referral to dermatology for mole/nevus check. No FH of skin CA, no prior derm. Multiple, few tags, no new changes.    Immunization History  Administered Date(s) Administered   Influenza-Unspecified 03/14/2015   Moderna Sars-Covid-2 Vaccination 08/18/2019, 09/15/2019, 04/15/2020   Tdap 09/30/2014  Had covid vaccine and booster.   No results found. No recent optho - no CL/glasses.   Dentist: every 6 months.   Alcohol/tobacco. No tobacco. 3 drinks per week.   Exercise: back into exercise 3-5 days per week, HIIT, walks.     History Patient Active Problem List   Diagnosis Date Noted   Hypogonadism in male 08/18/2018   Abdominal pain, LLQ 03/19/2017   Diarrhea of presumed infectious origin 03/19/2017   BRBPR (bright red blood per rectum) 03/19/2017   Paroxysmal atrial fibrillation ([place]) 12/27/2012   Past Medical History:  Diagnosis Date   A-fib ([place])    Heart murmur    Mitral valve prolapse    Paroxysmal atrial fibrillation ([place])    Testosterone deficiency    Past Surgical History:  Procedure Laterality Date   HIP SURGERY     NM [place]  12/11/2004   [place], MD: no ischemia   SURGERY SCROTAL / TESTICULAR  06/1994   torsion   US ECHOCARDIOGRAPHY  10/06/2010   [place] mildly dilated,trace MR   WRIST SURGERY  05/2004   tendon repair   No Known Allergies Prior to Admission medications   Medication Sig Start Date End Date Taking? Authorizing Provider  sildenafil (VIAGRA) 100 MG tablet TAKE 1/2 TO 1 TABLET BY MOUTH EVERY DAY AS NEEDED 11/09/19  Yes Wendie Agreste, MD  testosterone (ANDROGEL) 50 MG/5GM (1%) GEL APPLY 5 GRAMS EXTERNALLY TO THE SKIN DAILY 08/18/18  Yes Wendie Agreste, MD  doxycycline (VIBRAMYCIN) 100 MG capsule Take 1 capsule (100 mg total) by mouth 2 (two) times daily. 09/30/19   Jaynee Eagles, PA-C  hydrOXYzine (ATARAX/VISTARIL) 25 MG tablet Take 1-2 tablets (25-50 mg total) by mouth 2 (two) times daily as needed for itching. 11/06/17   Jaynee Eagles, PA-C  naproxen (NAPROSYN) 500 MG tablet Take 1 tablet (500 mg total) by mouth 2 (two) times daily. 09/30/19   Jaynee Eagles, PA-C   Social History   Socioeconomic History   Marital status: Single    Spouse name: Not on file   Number of children: Not on file   Years of education: Not on file   Highest education level: Not on file  Occupational History   Not on file  Tobacco Use   Smoking status: Former    Types: Cigars   Smokeless tobacco: Never  Vaping Use   Vaping Use: Never used  Substance and Sexual Activity   Alcohol use: Yes    Comment: social   Drug use: No   Sexual activity: Yes    Birth control/protection: None  Other Topics Concern   Not on file  Social History Narrative   Not on file   Social Determinants of Health   Financial Resource Strain: Not on file  Food Insecurity: Not on file  Transportation Needs: Not on file  Physical Activity: Not on file  Stress: Not on file  Social Connections: Not on file  Intimate Partner Violence: Not on file    Review of Systems  13 point review of systems per patient health survey noted.  Negative other than as indicated above or in HPI.   Objective:   Vitals:   12/04/20 1420  BP: 128/74  Pulse: 72  Resp: 17  Temp: 98.3 F (36.8 C)  TempSrc: Temporal  SpO2: 97%  Weight: 221 lb 6.4 oz (100.4 kg)  Height: 6\' 1"  (1.854 m)     Physical Exam Vitals reviewed.  Constitutional:      Appearance: He is well-developed.  HENT:     Head: Normocephalic and atraumatic.     Right Ear: External ear normal.     Left Ear: External ear normal.  Eyes:     Conjunctiva/sclera: Conjunctivae normal.     Pupils: Pupils are equal, round, and reactive to light.  Neck:     Thyroid: No thyromegaly.  Cardiovascular:     Rate and Rhythm: Normal rate and regular rhythm.     Heart sounds: Normal heart sounds.  Pulmonary:     Effort: Pulmonary effort is normal. No respiratory distress.     Breath sounds: Normal breath sounds. No wheezing.  Abdominal:     General: There is no distension.     Palpations: Abdomen is soft.     Tenderness: There  is no abdominal tenderness.  Musculoskeletal:        General: No tenderness. Normal range of motion.     Cervical back: Normal range of motion and neck supple.  Lymphadenopathy:     Cervical: No cervical adenopathy.  Skin:    General: Skin is warm and dry.     Comments: Multiple small nevi across back, few on chest/abdomen with few cherry angiomas.  No concerning areas/nevi.  Neurological:     Mental Status: He is alert and oriented to person, place, and time.     Deep Tendon Reflexes: Reflexes are normal and symmetric.  Psychiatric:        Behavior: Behavior normal.    Assessment & Plan:  [name] is a 35 y.o. male . Annual physical exam  - -anticipatory guidance as below in AVS, screening labs above. Health maintenance items as above in HPI discussed/recommended as applicable.  Fasting labs this am.   Erectile dysfunction, unspecified erectile dysfunction type - Plan: sildenafil (VIAGRA) 100 MG tablet  - viagra Rx given - use lowest effective dose. Side effects discussed (including but not limited to headache/flushing, blue discoloration of vision, possible vascular steal and risk of cardiac effects if underlying unknown coronary artery disease, and permanent sensorineural hearing loss). Understanding expressed.  Paroxysmal A-fib ([place]) - Plan: Ambulatory referral to Cardiology  - asx, refer to cardiology for ongoing care.   Multiple nevi - Plan: Ambulatory referral to Dermatology, but no concerning areas.   Hypogonadism in male  -will check labs, but defers restart supplementation at this time.   Meds ordered this encounter  Medications   sildenafil (VIAGRA) 100 MG tablet    Sig: TAKE 1/2 TO 1 TABLET BY MOUTH EVERY DAY AS NEEDED    Dispense:  5 tablet    Refill:  5   Patient Instructions  I will refer you to dermatology and cardiology.  Depending on lab work can consider follow-up in the next 3 to 6 months with increased exercise.  Also will check testosterone  level to decide if restarting medication is needed.  Viagra refilled if needed, use lowest effective dose.   Let me know if there are questions.  Good luck with the wedding, and follow-up with me in 1 year for physical unless labs indicate visit needed sooner.    Signed,   [name], MD [place], [facility] 12/04/20 3:09 PM

## 2020-12-04 NOTE — Progress Notes (Signed)
test

## 2021-02-10 ENCOUNTER — Other Ambulatory Visit: Payer: Self-pay

## 2021-02-10 ENCOUNTER — Encounter: Payer: Self-pay | Admitting: Cardiovascular Disease

## 2021-02-10 ENCOUNTER — Ambulatory Visit (INDEPENDENT_AMBULATORY_CARE_PROVIDER_SITE_OTHER): Payer: 59 | Admitting: Cardiovascular Disease

## 2021-02-10 VITALS — BP 118/84 | HR 67 | Ht 74.0 in | Wt 225.0 lb

## 2021-02-10 DIAGNOSIS — I480 Paroxysmal atrial fibrillation: Secondary | ICD-10-CM | POA: Diagnosis not present

## 2021-02-10 DIAGNOSIS — I4891 Unspecified atrial fibrillation: Secondary | ICD-10-CM

## 2021-02-10 NOTE — Patient Instructions (Signed)
Medication Instructions:  No changes *If you need a refill on your cardiac medications before your next appointment, please call your pharmacy*   Lab Work: None ordered If you have labs (blood work) drawn today and your tests are completely normal, you will receive your results only by: [place] (if you have MyChart) OR A paper copy in the mail If you have any lab test that is abnormal or we need to change your treatment, we will call you to review the results.   Testing/Procedures: None ordered   Follow-Up: At [facility], you and your health needs are our priority.  As part of our continuing mission to provide you with exceptional heart care, we have created designated Provider Care Teams.  These Care Teams include your primary Cardiologist (physician) and Advanced Practice Providers (APPs -  Physician Assistants and Nurse Practitioners) who all work together to provide you with the care you need, when you need it.  We recommend signing up for the patient portal called "MyChart".  Sign up information is provided on this After Visit Summary.  MyChart is used to connect with patients for Virtual Visits (Telemedicine).  Patients are able to view lab/test results, encounter notes, upcoming appointments, etc.  Non-urgent messages can be sent to your provider as well.   To learn more about what you can do with MyChart, go to [email or web address].    Your next appointment:   12 month(s)  The format for your next appointment:   In Person  Provider:   You may see [name] or one of the following Advanced Practice Providers on your designated Care Team:   Almyra Deforest, PA-C Fabian Sharp, PA-C or  Roby Lofts, [place]

## 2021-02-10 NOTE — Progress Notes (Signed)
Cardiology Office Note:    Date:  02/11/2021   ID:  [name], DOB 08-05-1985, MRN [id number]  PCP:  Wendie Agreste, MD  Cardiologist:  Sanda Klein, MD    Referring MD: Wendie Agreste, MD   Chief complaint: Atrial fibrillation follow-up  History of Present Illness:    [name] is a 35 y.o. male with a hx of infrequent episodes of paroxysmal atrial fibrillation without evidence of significant structural heart disease. This is his first Cardiology appointment since 2018.  He has had infrequent episodes of palpitations lasting for a few minutes or a few hours at a time, almost always associated with alcohol excess.  He has markedly cut down his intake of alcohol.  The palpitations generally do not associate other unpleasant symptoms.  A few years ago he was exercising avidly and ran a half marathon, but he has not been able to exercise as much recently and has gained some weight.  He got married a couple of weeks ago.  He is wearing an apple watch.  The patient specifically denies any chest pain at rest exertion, dyspnea at rest or with exertion, orthopnea, paroxysmal nocturnal dyspnea, syncope, focal neurological deficits, intermittent claudication, lower extremity edema, unexplained weight gain, cough, hemoptysis or wheezing.  He does not have a history of bleeding or frequent injuries.  ECG today shows normal sinus rhythm with physiological respiratory arrhythmia and is a completely normal tracing.  Remote echocardiography showed a very mildly dilated left atrium at 42 mm, but no other structural abnormalities (although mitral valve prolapse is listed in his history, the echo only showed systolic bowing of the anterior mitral leaflet without frank prolapse and with only trace regurgitation)  Past Medical History:  Diagnosis Date   A-fib ([place])    Heart murmur    Mitral valve prolapse    Paroxysmal atrial fibrillation ([place])    Testosterone deficiency      Past Surgical History:  Procedure Laterality Date   HIP SURGERY     NM MYOCAR PERF WALL MOTION  12/11/2004   [place], MD: no ischemia   SURGERY SCROTAL / TESTICULAR  06/1994   torsion   US ECHOCARDIOGRAPHY  10/06/2010   LA mildly dilated,trace MR   WRIST SURGERY  05/2004   tendon repair    Current Medications: Current Meds  Medication Sig   sildenafil (VIAGRA) 100 MG tablet TAKE 1/2 TO 1 TABLET BY MOUTH EVERY DAY AS NEEDED     Allergies:   Patient has no known allergies.   Social History   Socioeconomic History   Marital status: Married    Spouse name: Not on file   Number of children: Not on file   Years of education: Not on file   Highest education level: Not on file  Occupational History   Not on file  Tobacco Use   Smoking status: Former    Types: Cigars   Smokeless tobacco: Never  Vaping Use   Vaping Use: Never used  Substance and Sexual Activity   Alcohol use: Yes    Comment: social   Drug use: No   Sexual activity: Yes    Birth control/protection: None  Other Topics Concern   Not on file  Social History Narrative   Not on file   Social Determinants of Health   Financial Resource Strain: Not on file  Food Insecurity: Not on file  Transportation Needs: Not on file  Physical Activity: Not on file  Stress: Not on file  Social Connections: Not on file     Family History: The patient's family history includes Cancer in his maternal grandmother, mother, paternal grandfather, and paternal grandmother; Diabetes in his father; Hyperlipidemia in his father; Hypertension in his father. ROS:   Please see the history of present illness.     All other systems reviewed and are negative.  EKGs/Labs/Other Studies Reviewed:    The following studies were reviewed today:   EKG:  EKG is  ordered today.  Normal sinus rhythm with respiratory arrhythmia.  Normal tracing, personally reviewed  Recent Labs: 12/04/2020: ALT 29; BUN 12; Creatinine, Ser 1.12;  Hemoglobin 15.1; Platelets 285.0; Potassium 4.2; Sodium 139; TSH 2.46  Recent Lipid Panel    Component Value Date/Time   CHOL 222 (H) 12/04/2020 0838   CHOL 171 08/15/2018 0849   TRIG 87.0 12/04/2020 0838   HDL 68.20 12/04/2020 0838   HDL 57 08/15/2018 0849   CHOLHDL 3 12/04/2020 0838   VLDL 17.4 12/04/2020 0838   LDLCALC 136 (H) 12/04/2020 0838   LDLCALC 100 (H) 08/15/2018 0849    Physical Exam:    VS:  BP 118/84 (BP Location: Left Arm, Patient Position: Sitting, Cuff Size: Large)   Pulse 67   Ht 6\' 2"  (1.88 m)   Wt 225 lb (102.1 kg)   SpO2 98%   BMI 28.89 kg/m     Wt Readings from Last 3 Encounters:  02/10/21 225 lb (102.1 kg)  12/04/20 221 lb 6.4 oz (100.4 kg)  09/30/19 227 lb 12.8 oz (103.3 kg)     General: Alert, oriented x3, no distress, moderately overweight Head: no evidence of trauma, PERRL, EOMI, no exophtalmos or lid lag, no myxedema, no xanthelasma; normal ears, nose and oropharynx Neck: normal jugular venous pulsations and no hepatojugular reflux; brisk carotid pulses without delay and no carotid bruits Chest: clear to auscultation, no signs of consolidation by percussion or palpation, normal fremitus, symmetrical and full respiratory excursions Cardiovascular: normal position and quality of the apical impulse, regular rhythm, normal first and second heart sounds, no murmurs, rubs or gallops.  Cannot elicit systolic click or murmur with Valsalva maneuver. Abdomen: no tenderness or distention, no masses by palpation, no abnormal pulsatility or arterial bruits, normal bowel sounds, no hepatosplenomegaly Extremities: no clubbing, cyanosis or edema; 2+ radial, ulnar and brachial pulses bilaterally; 2+ right femoral, posterior tibial and dorsalis pedis pulses; 2+ left femoral, posterior tibial and dorsalis pedis pulses; no subclavian or femoral bruits Neurological: grossly nonfocal Psych: Normal mood and affect   ASSESSMENT:    1. Paroxysmal atrial fibrillation  (HCC)    PLAN:    In order of problems listed above:  AFib: Episodes are brief and not particularly symptomatic or frequent.  Avoid alcohol, especially binge excess.  Try to avoid gaining weight since high weights are associated with increased prevalence of arrhythmia.  At this point he has low embolic risk ([id number]-[id number] score is 0), but he should promptly communicate any focal neurological events, and even if they are transient. Abnormal ECG: His older ECG showed a moderate intraventricular conduction delay, but the current ECG shows a QRS of 92 ms, normal. Previous normal treadmill stress test.  Previous echo showed mild left atrial dilation but was otherwise unremarkable. History of mitral valve prolapse: Not confirmed on the most recent echocardiogram, no click or murmur heard on his physical exam even with provocative maneuvers.   Medication Adjustments/Labs and Tests Ordered: Current medicines are reviewed at length with the patient today.  Concerns regarding  medicines are outlined above.  Orders Placed This Encounter  Procedures   EKG 12-Lead   No orders of the defined types were placed in this encounter.   Signed, Sanda Klein, MD  02/11/2021 8:49 AM    [facility]

## 2021-07-05 ENCOUNTER — Encounter: Payer: Self-pay | Admitting: Cardiovascular Disease

## 2021-07-07 ENCOUNTER — Telehealth: Payer: Self-pay

## 2021-07-07 NOTE — Telephone Encounter (Signed)
NOTES SCANNED TO REFERRAL 

## 2021-07-14 ENCOUNTER — Telehealth: Payer: Self-pay | Admitting: Cardiovascular Disease

## 2021-07-14 ENCOUNTER — Other Ambulatory Visit: Payer: Self-pay | Admitting: *Deleted

## 2021-07-14 NOTE — Telephone Encounter (Signed)
° ° ° °  Pre-operative Risk Assessment    Patient Name: [name]  DOB: February 26, 1986 MRN: [id number]     Request for Surgical Clearance    Procedure:   dental teeth cleaning  and x-ray   Date of Surgery:  Clearance 07/17/21                                 Surgeon:  Dr. Casimer Lanius Surgeon's Group or Practice Name:  [facility]  Phone number:  [phone] Fax number:  [phone]   Type of Clearance Requested:   - Medical  - Pharmacy:  Hold not sure what kind of blood thinner it was given while he is in the hospital      Type of Anesthesia:  None    Additional requests/questions:   pt has an appt with Dr. Loletha Grayer tomorrow and wanted clearance for cleaning and xray, pt told them he is on blood thinner now given while he is in the hospital recently  Signed, Selinda Orion   07/14/2021, 10:47 AM

## 2021-07-14 NOTE — Telephone Encounter (Signed)
° °  Patient Name: [name]  DOB: 02-02-1986 MRN: [id number]  Primary Cardiologist: Sanda Klein, MD  Chart reviewed as part of pre-operative protocol coverage. The patient has an upcoming visit scheduled with [name] on 07/15/2021, at which time clearance can be addressed in case there are any issues that would impact surgical recommendations.  Dental cleaning and x-ray scheduled for 07/17/2021 as below. Simple dental extractions are considered low risk procedures per guidelines and generally do not require any specific cardiac clearance. It is also generally accepted that for simple extractions and dental cleanings, there is no need to interrupt blood thinner therapy.   SBE prophylaxis is not required for the patient.  I added preop FYI to appointment note so that provider is aware to address at time of outpatient visit.  Per office protocol the cardiology provider should for their finalize clearance decision and recommendations regarding DOAC therapy to requesting party below. I will route this message as FYI to requesting party and remove this message from the preop box as separate preop APP in but not needed at this time. Please call with any questions.  Please call with questions.  Lenna Sciara, NP 07/14/2021, 2:37 PM

## 2021-07-15 ENCOUNTER — Ambulatory Visit (INDEPENDENT_AMBULATORY_CARE_PROVIDER_SITE_OTHER): Payer: 59 | Admitting: Cardiovascular Disease

## 2021-07-15 ENCOUNTER — Encounter: Payer: Self-pay | Admitting: Cardiovascular Disease

## 2021-07-15 ENCOUNTER — Other Ambulatory Visit: Payer: Self-pay

## 2021-07-15 VITALS — BP 112/68 | HR 51 | Ht 74.0 in | Wt 223.2 lb

## 2021-07-15 DIAGNOSIS — I480 Paroxysmal atrial fibrillation: Secondary | ICD-10-CM

## 2021-07-15 NOTE — Progress Notes (Signed)
Cardiology Office Note:    Date:  07/15/2021   ID:  [name], DOB [birth year], MRN [id number]  PCP:  Xavier Agreste, MD   [facility] Providers Cardiologist:  Sanda Klein, MD     Referring MD: Xavier Agreste, MD   Chief Complaint  Patient presents with   Irregular Heart Beat         History of Present Illness:    [name] is a 36 y.o. male with a hx of infrequent paroxysmal atrial fibrillation who was recently hospitalized with atrial fibrillation with rapid ventricular response at [facility]  He had onset of rapid palpitations in the setting of nausea and vomiting and had ingested more alcohol than usual the evening before (probably 8 beers and 1 shot).  Initial treatment with intravenous diltiazem for rate control was limited by hypotension and he eventually underwent TEE guided cardioversion.  TEE showed a dilated left atrium, but no clot and normal left atrial appendage velocities.  LVEF was 50-55%.  There was no evidence of any focal valve abnormalities and specifically there was no mitral valve prolapse.  He required a single 200 J shock for conversion to sinus rhythm.  He has maintained normal sinus rhythm since then, but has been markedly bradycardic on low-dose metoprolol 25 mg twice daily.  His heart rate is often in the 40s.  He feels lightheadedness frequently, especially if he bends over.  He is very tired.  He has not had any recurrence of palpitations.  He denies any injuries or bleeding problems while taking Eliquis (he is scheduled to stop this after a month of therapy).  Past Medical History:  Diagnosis Date   A-fib ([place])    Heart murmur    Mitral valve prolapse    Paroxysmal atrial fibrillation (HCC)    Testosterone deficiency     Past Surgical History:  Procedure Laterality Date   HIP SURGERY     NM MYOCAR PERF WALL MOTION  12/11/2004   [place], MD: no ischemia   SURGERY SCROTAL / TESTICULAR  06/1994   torsion   US  ECHOCARDIOGRAPHY  10/06/2010   LA mildly dilated,trace MR   WRIST SURGERY  05/2004   tendon repair    Current Medications: Current Meds  Medication Sig   folic acid (FOLVITE) 1 MG tablet Take 1 mg by mouth daily.   multivitamin (ONE-A-DAY MEN'S) TABS tablet Take 1 tablet by mouth daily in the afternoon.   thiamine 100 MG tablet Take 100 mg by mouth daily in the afternoon.   [DISCONTINUED] ELIQUIS 5 MG TABS tablet Take 5 mg by mouth 2 (two) times daily.   [DISCONTINUED] metoprolol tartrate (LOPRESSOR) 25 MG tablet Take 25 mg by mouth 2 (two) times daily.     Allergies:   Patient has no known allergies.   Social History   Socioeconomic History   Marital status: Married    Spouse name: Not on file   Number of children: Not on file   Years of education: Not on file   Highest education level: Not on file  Occupational History   Not on file  Tobacco Use   Smoking status: Former    Types: Cigars   Smokeless tobacco: Never  Vaping Use   Vaping Use: Never used  Substance and Sexual Activity   Alcohol use: Yes    Comment: social   Drug use: No   Sexual activity: Yes    Birth control/protection: None  Other Topics Concern  Not on file  Social History Narrative   Not on file   Social Determinants of Health   Financial Resource Strain: Not on file  Food Insecurity: Not on file  Transportation Needs: Not on file  Physical Activity: Not on file  Stress: Not on file  Social Connections: Not on file     Family History: The patient's family history includes Cancer in his maternal grandmother, mother, paternal grandfather, and paternal grandmother; Diabetes in his father; Hyperlipidemia in his father; Hypertension in his father.  ROS:   Please see the history of present illness.     All other systems reviewed and are negative.  EKGs/Labs/Other Studies Reviewed:    The following studies were reviewed today: Transesophageal echo [facility] 07/06/2021 Impression  Left  Ventricle: Systolic function is low normal. EF: 50-55%.    Right Ventricle: Systolic function is normal.    Left Atrium: No left atrial or left atrial appendage thrombus.    EKG:  EKG is  ordered today.  The ekg ordered today demonstrates sinus bradycardia 51 bpm, otherwise normal tracing.  There is no evidence of preexcitation.  The QRS is relatively narrow at 100 ms.  There are no repolarization abnormalities.  QT (uncorrected) 428 ms  Recent Labs: 12/04/2020: ALT 29; BUN 12; Creatinine, Ser 1.12; Hemoglobin 15.1; Platelets 285.0; Potassium 4.2; Sodium 139; TSH 2.46   07/06/2021 potassium 4.0, creatinine 1.00, normal liver function tests hemoglobin A1c 5.2% normal cardiac enzymes, negative UDS normal TSH 1.46 Recent Lipid Panel    Component Value Date/Time   CHOL 222 (H) 12/04/2020 0838   CHOL 171 08/15/2018 0849   TRIG 87.0 12/04/2020 0838   HDL 68.20 12/04/2020 0838   HDL 57 08/15/2018 0849   CHOLHDL 3 12/04/2020 0838   VLDL 17.4 12/04/2020 0838   LDLCALC 136 (H) 12/04/2020 0838   LDLCALC 100 (H) 08/15/2018 0849     Risk Assessment/Calculations:    CHA2DS2-VASc Score = 0   This indicates a 0.2% annual risk of stroke. The patient's score is based upon: CHF History: 0 HTN History: 0 Diabetes History: 0 Stroke History: 0 Vascular Disease History: 0 Age Score: 0 Gender Score: 0           Physical Exam:    VS:  BP 112/68 (BP Location: Left Arm, Patient Position: Sitting, Cuff Size: Large)    Pulse (!) 51    Ht 6\' 2"  (1.88 m)    Wt 223 lb 3.2 oz (101.2 kg)    SpO2 100%    BMI 28.66 kg/m     Wt Readings from Last 3 Encounters:  07/15/21 223 lb 3.2 oz (101.2 kg)  02/10/21 225 lb (102.1 kg)  12/04/20 221 lb 6.4 oz (100.4 kg)     GEN: Mildly overweight, well nourished, well developed in no acute distress HEENT: Normal NECK: No JVD; No carotid bruits LYMPHATICS: No lymphadenopathy CARDIAC: Bradycardic, RRR, no murmurs, rubs, gallops RESPIRATORY:  Clear to  auscultation without rales, wheezing or rhonchi  ABDOMEN: Soft, non-tender, non-distended MUSCULOSKELETAL:  No edema; No deformity  SKIN: Warm and dry NEUROLOGIC:  Alert and oriented x 3 PSYCHIATRIC:  Normal affect   ASSESSMENT:    1. Paroxysmal atrial fibrillation (HCC)    PLAN:    In order of problems listed above:  Paroxysmal atrial fibrillation: Low embolic risk.  Stop Eliquis after 1 month.  Intolerance to even low-dose beta-blocker therapy due to fatigue and dizziness.  We will wean off metoprolol over the next 2-3 days.  If he has recurrent atrial fibrillation he would be a good candidate for "pill in the pocket" approach with flecainide.  At this point the episodes of atrial fibrillation are too infrequent (4 times in 16 years, 1 hospitalization in the last 10 years) to justify daily antiarrhythmic therapy or referral to ablation.  However, if the frequency of atrial fibrillation increases she would be an excellent candidate for referral to a early radiofrequency ablation due to his young age and relative absence of structural heart abnormality (only has mild left atrial dilation).  Avoid triggers including alcohol consumption.  Avoid weight gain since this will increase the prevalence of atrial fibrillation in the future.           Medication Adjustments/Labs and Tests Ordered: Current medicines are reviewed at length with the patient today.  Concerns regarding medicines are outlined above.  Orders Placed This Encounter  Procedures   EKG 12-Lead   No orders of the defined types were placed in this encounter.   Patient Instructions  Medication Instructions:  Metoprolol: Take half a tablet for two days and then discontinue Eliquis: Discontinue around March 13th  *If you need a refill on your cardiac medications before your next appointment, please call your pharmacy*   Lab Work: None ordered If you have labs (blood work) drawn today and your tests are completely  normal, you will receive your results only by: [place] (if you have MyChart) OR A paper copy in the mail If you have any lab test that is abnormal or we need to change your treatment, we will call you to review the results.   Testing/Procedures: None ordered   Follow-Up: At [facility], you and your health needs are our priority.  As part of our continuing mission to provide you with exceptional heart care, we have created designated Provider Care Teams.  These Care Teams include your primary Cardiologist (physician) and Advanced Practice Providers (APPs -  Physician Assistants and Nurse Practitioners) who all work together to provide you with the care you need, when you need it.  We recommend signing up for the patient portal called "MyChart".  Sign up information is provided on this After Visit Summary.  MyChart is used to connect with patients for Virtual Visits (Telemedicine).  Patients are able to view lab/test results, encounter notes, upcoming appointments, etc.  Non-urgent messages can be sent to your provider as well.   To learn more about what you can do with MyChart, go to [email or web address].    Your next appointment:   12 month(s)  The format for your next appointment:   In Person  Provider:   Sanda Klein, MD {      Signed, Sanda Klein, MD  07/15/2021 9:02 AM    [place]

## 2021-07-15 NOTE — Patient Instructions (Signed)
Medication Instructions:  Metoprolol: Take half a tablet for two days and then discontinue Eliquis: Discontinue around March 13th  *If you need a refill on your cardiac medications before your next appointment, please call your pharmacy*   Lab Work: None ordered If you have labs (blood work) drawn today and your tests are completely normal, you will receive your results only by: [place] (if you have MyChart) OR A paper copy in the mail If you have any lab test that is abnormal or we need to change your treatment, we will call you to review the results.   Testing/Procedures: None ordered   Follow-Up: At [facility], you and your health needs are our priority.  As part of our continuing mission to provide you with exceptional heart care, we have created designated Provider Care Teams.  These Care Teams include your primary Cardiologist (physician) and Advanced Practice Providers (APPs -  Physician Assistants and Nurse Practitioners) who all work together to provide you with the care you need, when you need it.  We recommend signing up for the patient portal called "MyChart".  Sign up information is provided on this After Visit Summary.  MyChart is used to connect with patients for Virtual Visits (Telemedicine).  Patients are able to view lab/test results, encounter notes, upcoming appointments, etc.  Non-urgent messages can be sent to your provider as well.   To learn more about what you can do with MyChart, go to [email or web address].    Your next appointment:   12 month(s)  The format for your next appointment:   In Person  Provider:   Sanda Klein, MD {

## 2021-08-07 ENCOUNTER — Ambulatory Visit: Payer: 59 | Admitting: Nurse Practitioner

## 2021-11-15 ENCOUNTER — Emergency Department (HOSPITAL_COMMUNITY)
Admission: EM | Admit: 2021-11-15 | Discharge: 2021-11-16 | Disposition: A | Discharge status: Home / Self Care | Payer: Managed Care, Other (non HMO) | Attending: Emergency Medicine | Admitting: Emergency Medicine

## 2021-11-15 ENCOUNTER — Encounter (HOSPITAL_COMMUNITY): Payer: Self-pay | Admitting: Emergency Medicine

## 2021-11-15 ENCOUNTER — Emergency Department (HOSPITAL_COMMUNITY): Payer: Managed Care, Other (non HMO)

## 2021-11-15 ENCOUNTER — Other Ambulatory Visit: Payer: Self-pay

## 2021-11-15 DIAGNOSIS — R7401 Elevation of levels of liver transaminase levels: Secondary | ICD-10-CM | POA: Insufficient documentation

## 2021-11-15 DIAGNOSIS — I480 Paroxysmal atrial fibrillation: Secondary | ICD-10-CM | POA: Insufficient documentation

## 2021-11-15 DIAGNOSIS — R002 Palpitations: Secondary | ICD-10-CM | POA: Diagnosis present

## 2021-11-15 LAB — CBC WITH DIFFERENTIAL/PLATELET
Abs Immature Granulocytes: 0.02 10*3/uL (ref 0.00–0.07)
Basophils Absolute: 0.1 10*3/uL (ref 0.0–0.1)
Basophils Relative: 1 %
Eosinophils Absolute: 0.5 10*3/uL (ref 0.0–0.5)
Eosinophils Relative: 4 %
HCT: 43.6 % (ref 39.0–52.0)
Hemoglobin: 15.1 g/dL (ref 13.0–17.0)
Immature Granulocytes: 0 %
Lymphocytes Relative: 42 %
Lymphs Abs: 4.5 10*3/uL — ABNORMAL HIGH (ref 0.7–4.0)
MCH: 31.6 pg (ref 26.0–34.0)
MCHC: 34.6 g/dL (ref 30.0–36.0)
MCV: 91.2 fL (ref 80.0–100.0)
Monocytes Absolute: 0.8 10*3/uL (ref 0.1–1.0)
Monocytes Relative: 7 %
Neutro Abs: 4.9 10*3/uL (ref 1.7–7.7)
Neutrophils Relative %: 46 %
Platelets: 294 10*3/uL (ref 150–400)
RBC: 4.78 MIL/uL (ref 4.22–5.81)
RDW: 13.5 % (ref 11.5–15.5)
WBC: 10.7 10*3/uL — ABNORMAL HIGH (ref 4.0–10.5)
nRBC: 0 % (ref 0.0–0.2)

## 2021-11-15 LAB — COMPREHENSIVE METABOLIC PANEL
ALT: 54 U/L — ABNORMAL HIGH (ref 0–44)
AST: 26 U/L (ref 15–41)
Albumin: 3.7 g/dL (ref 3.5–5.0)
Alkaline Phosphatase: 51 U/L (ref 38–126)
Anion gap: 11 (ref 5–15)
BUN: 16 mg/dL (ref 6–20)
CO2: 25 mmol/L (ref 22–32)
Calcium: 8.9 mg/dL (ref 8.9–10.3)
Chloride: 102 mmol/L (ref 98–111)
Creatinine, Ser: 1.05 mg/dL (ref 0.61–1.24)
GFR, Estimated: >60 mL/min (ref >60)
Glucose, Bld: 88 mg/dL (ref 70–99)
Potassium: 3.6 mmol/L (ref 3.5–5.1)
Sodium: 138 mmol/L (ref 135–145)
Total Bilirubin: 0.6 mg/dL (ref 0.3–1.2)
Total Protein: 6.6 g/dL (ref 6.5–8.1)

## 2021-11-15 LAB — TROPONIN I (HIGH SENSITIVITY): Troponin I (High Sensitivity): 4 ng/L (ref <18)

## 2021-11-15 MED ORDER — DILTIAZEM HCL 30 MG PO TABS
30.0000 mg | ORAL_TABLET | Freq: Once | ORAL | Status: AC
  Administered 2021-11-16: 30 mg via ORAL
  Filled 2021-11-15: qty 1

## 2021-11-15 MED ORDER — APIXABAN 5 MG PO TABS
5.0000 mg | ORAL_TABLET | Freq: Two times a day (BID) | ORAL | Status: DC
  Administered 2021-11-16: 5 mg via ORAL
  Filled 2021-11-15: qty 1

## 2021-11-16 ENCOUNTER — Ambulatory Visit (INDEPENDENT_AMBULATORY_CARE_PROVIDER_SITE_OTHER): Payer: Managed Care, Other (non HMO) | Admitting: Physician Assistant

## 2021-11-16 ENCOUNTER — Telehealth: Payer: Self-pay | Admitting: Cardiovascular Disease

## 2021-11-16 ENCOUNTER — Encounter: Payer: Self-pay | Admitting: Cardiovascular Disease

## 2021-11-16 ENCOUNTER — Encounter: Payer: Self-pay | Admitting: Physician Assistant

## 2021-11-16 VITALS — BP 116/82 | HR 84 | Ht 74.0 in | Wt 227.0 lb

## 2021-11-16 DIAGNOSIS — I480 Paroxysmal atrial fibrillation: Secondary | ICD-10-CM | POA: Diagnosis not present

## 2021-11-16 LAB — TROPONIN I (HIGH SENSITIVITY): Troponin I (High Sensitivity): 5 ng/L (ref <18)

## 2021-11-16 LAB — MAGNESIUM: Magnesium: 2.1 mg/dL (ref 1.7–2.4)

## 2021-11-16 MED ORDER — DILTIAZEM HCL ER COATED BEADS 240 MG PO CP24: 240.0000 mg | ORAL_CAPSULE | Freq: Every day | ORAL | 0 refills | Status: DC

## 2021-11-16 MED ORDER — APIXABAN 5 MG PO TABS
5.0000 mg | ORAL_TABLET | Freq: Two times a day (BID) | ORAL | 0 refills | Status: DC
Start: 2021-11-16 — End: 2021-12-23

## 2021-11-16 MED ORDER — DILTIAZEM HCL 30 MG PO TABS
30.0000 mg | ORAL_TABLET | Freq: Once | ORAL | Status: AC
  Administered 2021-11-16: 30 mg via ORAL
  Filled 2021-11-16: qty 1

## 2021-11-16 NOTE — Progress Notes (Signed)
Cardiology Office Note:    Date:  11/18/2021   ID:  [name], DOB 1986-01-29, MRN [id number]  PCP:  Wendie Agreste, MD   [facility] Providers Cardiologist:  Sanda Klein, MD     Referring MD: Wendie Agreste, MD   Chief Complaint  Patient presents with   Follow-up    Post hospital.   Headache   Shortness of Breath   Tachycardia   Atrial Fibrillation    History of Present Illness:    [name] is a 36 y.o. male with a hx of infrequent paroxysmal atrial fibrillation.  Previous episode of A-fib has always been associated with alcohol use.  He has cut back on alcohol intake since then.  More recently, patient went to [facility] ED on 07/05/2021 due to recurrent A-fib and underwent TEE DCCV.  Hospital was complicated by significant drop in blood pressure after diltiazem. (Although per patient, he was dehydrated at the time which may have contributed to the hypotension.) He was started on metoprolol 25 mg twice a day and Eliquis 5 mg twice a day.  Unfortunately, he is unable to tolerate even the lowest dose of beta-blocker and his heart rate will drop down to the 40s causing him to have lightheadedness frequently.  Metoprolol was taken off on follow-up on 07/15/2021.  Patient was instructed to finish 4 weeks course of Eliquis before discontinuing Eliquis as well.   He was in [place] for bourbon festival last week when he went into atrial fibrillation after episode of vomiting on Friday (6/23) afternoon 3 PM.  He was seen in [place] ED who gave him rate control medication and told him to follow-up with his cardiologist.  He presented to the [facility] ED yesterday for persistent palpitation and elevated heart rate.  He has been drinking prior to the event.  On arrival, he was noted to be in A-fib with RVR.  Case was discussed with cardiology fellow, since the onset of palpitation was greater than 48 hours prior to arrival, cardioversion was not performed.   Patient was ultimately discharged on Eliquis 5 mg twice a day and 240 mg daily of Cardizem.   Patient presents today for post ED follow-up.  He was instructed by ED nurse to start on Cardizem tomorrow as he just received Cardizem in the emergency room shortly after midnight this morning.  Therefore, he has not started on the oral Cardizem yet.  Blood pressure on arrival was 100/76.  EKG on arrival shows atrial fibrillation with RVR with heart rate of 148.  This is consistent with his Apple watch tracing this afternoon around 1 PM that also showed heart rate in the 150s.  While in the A-fib, he has palpitation, shortness of breath, fatigue and dizziness.  During the interview, physical exam revealed regular pulse with HR in the 90s.  A EKG tracing was repeated which showed patient had converted back in normal sinus rhythm while waiting to be seen in the exam room.  Blood pressure has came up to 160/82, heart rate 84 bpm.  Given the prior history of hypotension and bradycardia associated with rate control medication, I am hesitant to start on the long-acting Cardizem.  I asked the patient to hold off on starting Cardizem CD tomorrow.  He should continue on the Eliquis at this time and likely will discontinue Eliquis after 1 month.  I will discuss the case with [name], I think it would be appropriate to place this patient on  a pill in the pocket flecainide for long-term management given the symptomatic nature.  If he has frequent recurrent A-fib, then may consider A-fib ablation in the future.  Addendum: I discussed the case with [name].  Although patient likely will be a good candidate for pill in the pocket flecainide, however ideally the first administration of pill in the pocket flecainide should be in the hospital under close observation to make sure he will not have any significant irregular rhythm afterward.  If he does have a recurrence in the future, may try to give a 300 mg dose of flecainide  in the emergency room as a trial.  If he passed the initial trial, then can use flecainide as outpatient.  For now, we are hesitant to start pill in the pocket flecainide as outpatient without the ability to closely observe the patient.  [name] does agree that diltiazem likely will cause hypotension and bradycardia as he had with metoprolol, we recommend he uses Cardizem only on a as needed basis for breakthrough palpitation.  Past Medical History:  Diagnosis Date   A-fib ([place])    Heart murmur    Mitral valve prolapse    Paroxysmal atrial fibrillation (HCC)    Testosterone deficiency     Past Surgical History:  Procedure Laterality Date   HIP SURGERY     NM MYOCAR PERF WALL MOTION  12/11/2004   [place], MD: no ischemia   SURGERY SCROTAL / TESTICULAR  06/1994   torsion   US ECHOCARDIOGRAPHY  10/06/2010   [place] mildly dilated,trace MR   WRIST SURGERY  05/2004   tendon repair    Current Medications: Current Meds  Medication Sig   apixaban (ELIQUIS) 5 MG TABS tablet Take 1 tablet (5 mg total) by mouth 2 (two) times daily.   aspirin EC 81 MG tablet Take 81 mg by mouth daily. Swallow whole.   diltiazem (CARTIA XT) 240 MG 24 hr capsule Take 1 capsule (240 mg total) by mouth daily.   Melatonin 5 MG CHEW Chew 5-10 mg by mouth at bedtime as needed (sleep).   metoprolol tartrate (LOPRESSOR) 25 MG tablet Take 25 mg by mouth 2 (two) times daily as needed (a-fib).   multivitamin (ONE-A-DAY MEN'S) TABS tablet Take 1 tablet by mouth daily.   sildenafil (VIAGRA) 100 MG tablet TAKE 1/2 TO 1 TABLET BY MOUTH EVERY DAY AS NEEDED (Patient taking differently: Take 50-100 mg by mouth daily as needed for erectile dysfunction.)     Allergies:   Patient has no known allergies.   Social History   Socioeconomic History   Marital status: Married    Spouse name: Not on file   Number of children: Not on file   Years of education: Not on file   Highest education level: Not on file  Occupational History    Not on file  Tobacco Use   Smoking status: Former    Types: Cigars   Smokeless tobacco: Never  Vaping Use   Vaping Use: Never used  Substance and Sexual Activity   Alcohol use: Yes    Comment: social   Drug use: No   Sexual activity: Yes    Birth control/protection: None  Other Topics Concern   Not on file  Social History Narrative   Not on file   Social Determinants of Health   Financial Resource Strain: Not on file  Food Insecurity: Not on file  Transportation Needs: Not on file  Physical Activity: Not on file  Stress: Not  on file  Social Connections: Not on file     Family History: The patient's family history includes Cancer in his maternal grandmother, mother, paternal grandfather, and paternal grandmother; Diabetes in his father; Hyperlipidemia in his father; Hypertension in his father.  ROS:   Please see the history of present illness.     All other systems reviewed and are negative.  EKGs/Labs/Other Studies Reviewed:    The following studies were reviewed today:  N/A  EKG:  EKG is ordered today.  The ekg ordered today demonstrates initial EKG on arrival showed atrial fibrillation with RVR with heart rate of 148, repeat EKG shows he has been back to sinus rhythm.  Recent Labs: 12/04/2020: TSH 2.46 11/15/2021: ALT 54; BUN 16; Creatinine, Ser 1.05; Hemoglobin 15.1; Platelets 294; Potassium 3.6; Sodium 138 11/16/2021: Magnesium 2.1  Recent Lipid Panel    Component Value Date/Time   CHOL 222 (H) 12/04/2020 0838   CHOL 171 08/15/2018 0849   TRIG 87.0 12/04/2020 0838   HDL 68.20 12/04/2020 0838   HDL 57 08/15/2018 0849   CHOLHDL 3 12/04/2020 0838   VLDL 17.4 12/04/2020 0838   LDLCALC 136 (H) 12/04/2020 0838   LDLCALC 100 (H) 08/15/2018 0849     Risk Assessment/Calculations:    CHA2DS2-VASc Score = 0   This indicates a 0.2% annual risk of stroke. The patient's score is based upon: CHF History: 0 HTN History: 0 Diabetes History: 0 Stroke History:  0 Vascular Disease History: 0 Age Score: 0 Gender Score: 0           Physical Exam:    VS:  BP 116/82 (BP Location: Right Arm, Patient Position: Supine, Cuff Size: Normal)   Pulse 84   Ht '6\' 2"'$  (1.88 m)   Wt 227 lb (103 kg)   BMI 29.15 kg/m     Wt Readings from Last 3 Encounters:  11/16/21 227 lb (103 kg)  11/16/21 219 lb 2.2 oz (99.4 kg)  07/15/21 223 lb 3.2 oz (101.2 kg)     GEN:  Well nourished, well developed in no acute distress HEENT: Normal NECK: No JVD; No carotid bruits LYMPHATICS: No lymphadenopathy CARDIAC: RRR, no murmurs, rubs, gallops RESPIRATORY:  Clear to auscultation without rales, wheezing or rhonchi  ABDOMEN: Soft, non-tender, non-distended MUSCULOSKELETAL:  No edema; No deformity  SKIN: Warm and dry NEUROLOGIC:  Alert and oriented x 3 PSYCHIATRIC:  Normal affect   ASSESSMENT:    1. Paroxysmal atrial fibrillation (HCC)    PLAN:    In order of problems listed above:  Paroxysmal atrial fibrillation: Patient had recurrent A-fib last week after drinking at [facility].  He has a history of A-fib related to alcohol consumption.  He is aware that he should cut back on drinking.  On initial arrival, he was in atrial fibrillation with RVR, however on physical exam, it was noted that his heart rate was quite irregular.  Repeat EKG shows he has been back into sinus rhythm while waiting in the exam room in the cardiology office.  He has been restarted on Eliquis yesterday in the emergency room.  He has not picked up his Cardizem.  I asked him to hold off on starting the Cardizem as he has a history of hypotension and bradycardia on rate control medication.  I discussed his case with [name], [name] recommended use Cardizem only as needed instead of as a scheduled medication.  We also discussed possibility of pill in the pocket flecainide.  [name]  recommended hold off on initiating flecainide as outpatient.  However if the patient is  in the emergency room next time for A-fib, we can do a trial of flecainide 300 mg under close observation to make sure he does not have significant ventricular arrhythmia afterward (assume flecainide given within 36 hours from onset of afib).  As long as he passed the initial trial, we can use the pill in the pocket form of flecainide in the future.           Medication Adjustments/Labs and Tests Ordered: Current medicines are reviewed at length with the patient today.  Concerns regarding medicines are outlined above.  Orders Placed This Encounter  Procedures   EKG 12-Lead   No orders of the defined types were placed in this encounter.   Patient Instructions  Medication Instructions:  HOLD Diltiazem  *If you need a refill on your cardiac medications before your next appointment, please call your pharmacy*  Lab Work: NONE ordered at this time of appointment   If you have labs (blood work) drawn today and your tests are completely normal, you will receive your results only by: [place] (if you have MyChart) OR A paper copy in the mail If you have any lab test that is abnormal or we need to change your treatment, we will call you to review the results.  Testing/Procedures: NONE ordered at this time of appointment   Follow-Up: At [facility], you and your health needs are our priority.  As part of our continuing mission to provide you with exceptional heart care, we have created designated Provider Care Teams.  These Care Teams include your primary Cardiologist (physician) and Advanced Practice Providers (APPs -  Physician Assistants and Nurse Practitioners) who all work together to provide you with the care you need, when you need it.  Your next appointment:   4 week(s)  The format for your next appointment:   In Person  Provider:   Almyra Deforest, PA-C on a day that [name] is in the office     Other Instructions   Important Information About Sugar          [name], [place]  11/18/2021 10:56 PM    [place]

## 2021-11-16 NOTE — Telephone Encounter (Signed)
Spoke with pt. He states he is still in A fib and can not take his at home dose of Diltiazem until tomorrow since he was just discharged this morning. Spoke with DOD, Dr. [place], he recommends pt keep his appointment this afternoon. Pt aware if he starts to have CP he should seek medical attention.

## 2021-11-16 NOTE — Telephone Encounter (Signed)
PA and covering staff aware of EKG/HR message received.  Patient arrived in office for 220 appt.  EKG to be obtained

## 2021-12-23 ENCOUNTER — Ambulatory Visit (INDEPENDENT_AMBULATORY_CARE_PROVIDER_SITE_OTHER): Payer: Managed Care, Other (non HMO) | Admitting: Physician Assistant

## 2021-12-23 ENCOUNTER — Encounter: Payer: Self-pay | Admitting: Physician Assistant

## 2021-12-23 VITALS — BP 116/74 | HR 76 | Ht 74.0 in | Wt 229.0 lb

## 2021-12-23 DIAGNOSIS — I480 Paroxysmal atrial fibrillation: Secondary | ICD-10-CM

## 2021-12-23 NOTE — Patient Instructions (Signed)
Medication Instructions:  Your physician recommends that you continue on your current medications as directed. Please refer to the Current Medication list given to you today.  *If you need a refill on your cardiac medications before your next appointment, please call your pharmacy*  Follow-Up: At [facility], you and your health needs are our priority.  As part of our continuing mission to provide you with exceptional heart care, we have created designated Provider Care Teams.  These Care Teams include your primary Cardiologist (physician) and Advanced Practice Providers (APPs -  Physician Assistants and Nurse Practitioners) who all work together to provide you with the care you need, when you need it.  We recommend signing up for the patient portal called "MyChart".  Sign up information is provided on this After Visit Summary.  MyChart is used to connect with patients for Virtual Visits (Telemedicine).  Patients are able to view lab/test results, encounter notes, upcoming appointments, etc.  Non-urgent messages can be sent to your provider as well.   To learn more about what you can do with MyChart, go to [email or web address].    Your next appointment:   6 month(s)  The format for your next appointment:   In Person  Provider:   Sanda Klein, MD {   Important Information About Sugar

## 2021-12-23 NOTE — Progress Notes (Unsigned)
Cardiology Office Note:    Date:  12/24/2021   ID:  [name], DOB 07-24-1985, MRN [id number]  PCP:  Wendie Agreste, MD   [place] Prov[facility] Cardiologist:  Sanda Klein, MD     Referring MD: Wendie Agreste, MD   Chief Complaint  Patient presents with   Follow-up    Seen for [name]    History of Present Illness:    [name] is a 36 y.o. male with a hx of infrequent paroxysmal atrial fibrillation.  Previous episode of A-fib has always been associated with alcohol use.  He has cut back on alcohol intake since then.  More recently, patient went to [facility] ED on 07/05/2021 due to recurrent A-fib and underwent TEE DCCV.  Hospital was complicated by significant drop in blood pressure after diltiazem. (Although per patient, he was dehydrated at the time which may have contributed to the hypotension.) He was started on metoprolol 25 mg twice a day and Eliquis 5 mg twice a day.  Unfortunately, he is unable to tolerate even the lowest dose of beta-blocker and his heart rate will drop down to the 40s causing him to have lightheadedness frequently.  Metoprolol was taken off on follow-up on 07/15/2021.  Patient was instructed to finish 4 weeks course of Eliquis before discontinuing Eliquis as well.   I last saw the patient on 11/16/2021 after he returned from [place] for bourbon festival.  Unfortunately during the bourbon festival, he went into atrial fibrillation on 11/13/2021.  He was seen in the [facility] ED and started on rate control medication and instructed him to follow-up with his cardiologist.  On arrival to follow-up visit, he was still in atrial fibrillation with RVR.  Interestingly, while talking to the patient, he self converted back to normal rhythm.  Although he was given Cardizem CD, we eventually decided to hold off on initiating the medication since he had history of significant fatigue and bradycardia on any rate control medication.  We plan to  continue to monitor him and potentially discontinue Eliquis after 1 month.  Although pill in the pocket strategy with flecainide may be a long-term solution, however we would prefer to initiate pill in the pocket strategy in the emergency room the next time he has any recurrent A-fib to make sure he does not have significant ventricular ectopy afterward before start using the strategy as outpatient.  Past Medical History:  Diagnosis Date   A-fib ([place])    Heart murmur    Mitral valve prolapse    Paroxysmal atrial fibrillation (HCC)    Testosterone deficiency     Past Surgical History:  Procedure Laterality Date   HIP SURGERY     NM MYOCAR PERF WALL MOTION  12/11/2004   [place], [place]: no ischemia   SURGERY SCROTAL / TESTICULAR  06/1994   torsion   US ECHOCARDIOGRAPHY  10/06/2010   [place] mildly dilated,trace MR   WRIST SURGERY  05/2004   tendon repair    Current Medications: Current Meds  Medication Sig   Melatonin 5 MG CHEW Chew 5-10 mg by mouth at bedtime as needed (sleep).   multivitamin (ONE-A-DAY MEN'S) TABS tablet Take 1 tablet by mouth daily.   sildenafil (VIAGRA) 100 MG tablet TAKE 1/2 TO 1 TABLET BY MOUTH EVERY DAY AS NEEDED (Patient taking differently: Take 50-100 mg by mouth daily as needed for erectile dysfunction.)     Allergies:   Patient has no known allergies.   Social History  Socioeconomic History   Marital status: Married    Spouse name: Not on file   Number of children: Not on file   Years of education: Not on file   Highest education level: Not on file  Occupational History   Not on file  Tobacco Use   Smoking status: Former    Types: Cigars   Smokeless tobacco: Never  Vaping Use   Vaping Use: Never used  Substance and Sexual Activity   Alcohol use: Yes    Comment: social   Drug use: No   Sexual activity: Yes    Birth control/protection: None  Other Topics Concern   Not on file  Social History Narrative   Not on file   Social Determinants of  Health   Financial Resource Strain: Not on file  Food Insecurity: Not on file  Transportation Needs: Not on file  Physical Activity: Not on file  Stress: Not on file  Social Connections: Not on file     Family History: The patient's family history includes Cancer in his maternal grandmother, mother, paternal grandfather, and paternal grandmother; Diabetes in his father; Hyperlipidemia in his father; Hypertension in his father.  ROS:   Please see the history of present illness.     All other systems reviewed and are negative.  EKGs/Labs/Other Studies Reviewed:    The following studies were reviewed today:  N/A  EKG:  EKG is not ordered today.    Recent Labs: 11/15/2021: ALT 54; BUN 16; Creatinine, Ser 1.05; Hemoglobin 15.1; Platelets 294; Potassium 3.6; Sodium 138 11/16/2021: Magnesium 2.1  Recent Lipid Panel    Component Value Date/Time   CHOL 222 (H) 12/04/2020 0838   CHOL 171 08/15/2018 0849   TRIG 87.0 12/04/2020 0838   HDL 68.20 12/04/2020 0838   HDL 57 08/15/2018 0849   CHOLHDL 3 12/04/2020 0838   VLDL 17.4 12/04/2020 0838   LDLCALC 136 (H) 12/04/2020 0838   LDLCALC 100 (H) 08/15/2018 0849     Risk Assessment/Calculations:    CHA2DS2-VASc Score = 0   This indicates a 0.2% annual risk of stroke. The patient's score is based upon: CHF History: 0 HTN History: 0 Diabetes History: 0 Stroke History: 0 Vascular Disease History: 0 Age Score: 0 Gender Score: 0           Physical Exam:    VS:  BP 116/74   Pulse 76   Ht '6\' 2"'$  (1.88 m)   Wt 229 lb (103.9 kg)   SpO2 99%   BMI 29.40 kg/m         Wt Readings from Last 3 Encounters:  12/23/21 229 lb (103.9 kg)  11/16/21 227 lb (103 kg)  11/16/21 219 lb 2.2 oz (99.4 kg)     GEN:  Well nourished, well developed in no acute distress HEENT: Normal NECK: No JVD; No carotid bruits LYMPHATICS: No lymphadenopathy CARDIAC: RRR, no murmurs, rubs, gallops RESPIRATORY:  Clear to auscultation without rales,  wheezing or rhonchi  ABDOMEN: Soft, non-tender, non-distended MUSCULOSKELETAL:  No edema; No deformity  SKIN: Warm and dry NEUROLOGIC:  Alert and oriented x 3 PSYCHIATRIC:  Normal affect   ASSESSMENT:    1. PAF (paroxysmal atrial fibrillation) ([place])    PLAN:    In order of problems listed above:  PAF: Patient was seen during the last office visit for recurrent atrial fibrillation after attending the [employer] in [place].  The episode of atrial fibrillation was triggered by alcohol.  He has a history of  alcohol induced A-fib.  Emphasis has been placed on alcohol cessation.  He is aware of the correlation between alcohol intake and recurrent A-fib.  He finished 1 month course of Eliquis and has came off of blood thinner at this point due to low CHA2DS2-VASc score.  In the future, if he does have recurrence of A-fib and goes to the emergency room, we may try the pill in the pocket flecainide strategy.  However we would prefer the first time he uses flecainide is in a controlled setting such as the emergency room or the hospital just in case he has any arrhythmia afterward.           Medication Adjustments/Labs and Tests Ordered: Current medicines are reviewed at length with the patient today.  Concerns regarding medicines are outlined above.  No orders of the defined types were placed in this encounter.  No orders of the defined types were placed in this encounter.   Patient Instructions  Medication Instructions:  Your physician recommends that you continue on your current medications as directed. Please refer to the Current Medication list given to you today.  *If you need a refill on your cardiac medications before your next appointment, please call your pharmacy*  Follow-Up: At [facility], you and your health needs are our priority.  As part of our continuing mission to provide you with exceptional heart care, we have created designated Provider Care Teams.   These Care Teams include your primary Cardiologist (physician) and Advanced Practice Providers (APPs -  Physician Assistants and Nurse Practitioners) who all work together to provide you with the care you need, when you need it.  We recommend signing up for the patient portal called "MyChart".  Sign up information is provided on this After Visit Summary.  MyChart is used to connect with patients for Virtual Visits (Telemedicine).  Patients are able to view lab/test results, encounter notes, upcoming appointments, etc.  Non-urgent messages can be sent to your provider as well.   To learn more about what you can do with MyChart, go to [email or web address].    Your next appointment:   6 month(s)  The format for your next appointment:   In Person  Provider:   Sanda Klein, MD {   Important Information About Lisa Roca, [place]  12/24/2021 4:12 PM    [place]

## 2021-12-24 ENCOUNTER — Encounter: Payer: Self-pay | Admitting: Physician Assistant

## 2023-03-03 NOTE — Progress Notes (Signed)
Cardiology Clinic Note   Patient Name: [name] Date of Encounter: 03/07/2023  Primary Care Provider:  Shade Flood, MD Primary Cardiologist:  Thurmon Fair, MD  Patient Profile    37 year old male with history of infrequent PAF, usually associated with alcohol use.  He is unable to tolerate beta-blocker due to symptomatic bradycardia.  Last seen by Azalee Course, PA August 2023 at which time he was having recurrent atrial fibrillation after returning from a "bourbon festival" in [place].  He was seen in ED at that time in [place].  He has a CHA2DS2-VASc score of 0.  Consideration for pill in the pocket with flecainide but would prefer this being done in a clinical environment to evaluate his response as his A-fib is infrequent and associated with ETOH use.  Past Medical History    Past Medical History:  Diagnosis Date   A-fib (HCC)    Heart murmur    Mitral valve prolapse    Paroxysmal atrial fibrillation (HCC)    Testosterone deficiency    Past Surgical History:  Procedure Laterality Date   HIP SURGERY     NM MYOCAR PERF WALL MOTION  12/11/2004   [place], MD: no ischemia   SURGERY SCROTAL / TESTICULAR  06/1994   torsion   US ECHOCARDIOGRAPHY  10/06/2010   [place] mildly dilated,trace MR   WRIST SURGERY  05/2004   tendon repair    Allergies  No Known Allergies  History of Present Illness    [name] returns today for annual follow-up for infrequent PAF always associated with EtOH use.  Several visits to the ED after alcohol intake requiring pharmacologic cardioversion or spontaneous return to normal sinus rhythm with hydration.  He has been on and off Eliquis for 1 month at a time when he was found to have A-fib.  The patient has been admitted to [facility] hospital from 02/26/2023 to 02/28/2019 for for atrial  with RVR   The patient was working in his yard felt his heart rate, irregular slightly rapid and was evaluated in the ED.  Review of care everywhere  the patient was treated with IV fluids, and given flecainide 150 mg p.o., approximately 12 hours later the patient was given additional 300 mg and converted to normal sinus rhythm.  The patient has been given a prescription for as needed flecainide 100 mg "pill in the pocket" to use for recurrent atrial fibrillation.  He also has been on metoprolol which he uses as needed only for atrial febrile or to use of flecainide.  The patient is very physically active goes to the gym 7 days a week doing a intense cardio workout.  He denies any episodes of atrial fibrillation during workout.  He had not had any alcohol use prior to the episode.  He was simply working out in his yard when this occurred.  He had worked out that morning with no issues.  He is currently not on any anticoagulation.  Home Medications    Current Outpatient Medications  Medication Sig Dispense Refill   aspirin EC 81 MG tablet Take 81 mg by mouth daily. Swallow whole.     flecainide (TAMBOCOR) 100 MG tablet Take by mouth.     Melatonin 5 MG CHEW Chew 5-10 mg by mouth at bedtime as needed (sleep).     metoprolol succinate (TOPROL-XL) 25 MG 24 hr tablet Take by mouth.     multivitamin (ONE-A-DAY MEN'S) TABS tablet Take 1 tablet by mouth daily.  sildenafil (VIAGRA) 100 MG tablet TAKE 1/2 TO 1 TABLET BY MOUTH EVERY DAY AS NEEDED (Patient taking differently: Take 50-100 mg by mouth daily as needed for erectile dysfunction.) 5 tablet 5   No current facility-administered medications for this visit.     Family History    Family History  Problem Relation Age of Onset   Cancer Mother    Diabetes Father    Hypertension Father    Hyperlipidemia Father    Cancer Maternal Grandmother    Cancer Paternal Grandmother    Cancer Paternal Grandfather    He indicated that his mother is alive. He indicated that his father is alive. He indicated that his brother is alive. He indicated that his maternal grandmother is alive. He indicated that  his maternal grandfather is alive. He indicated that his paternal grandmother is deceased. He indicated that his paternal grandfather is deceased.  Social History    Social History   Socioeconomic History   Marital status: Married    Spouse name: Not on file   Number of children: Not on file   Years of education: Not on file   Highest education level: Not on file  Occupational History   Not on file  Tobacco Use   Smoking status: Former    Types: Cigars   Smokeless tobacco: Never  Vaping Use   Vaping status: Never Used  Substance and Sexual Activity   Alcohol use: Yes    Comment: social   Drug use: No   Sexual activity: Yes    Birth control/protection: None  Other Topics Concern   Not on file  Social History Narrative   Not on file   Social Determinants of Health   Financial Resource Strain: Low Risk  (03/01/2023)   Received from [facility]   Overall Financial Resource Strain (CARDIA)    Difficulty of Paying Living Expenses: Not hard at all  Food Insecurity: No Food Insecurity (03/01/2023)   Received from [facility]   Hunger Vital Sign    Worried About Running Out of Food in the Last Year: Never true    Ran Out of Food in the Last Year: Never true  Transportation Needs: No Transportation Needs (03/01/2023)   Received from [facility] - Transportation    Lack of Transportation (Medical): No    Lack of Transportation (Non-Medical): No  Physical Activity: Sufficiently Active (03/01/2023)   Received [facility]   Exercise Vital Sign    Days of Exercise per Week: 6 days    Minutes of Exercise per Session: 50 min  Stress: No Stress Concern Present (03/01/2023)   Received from [facility] - Occupational Stress Questionnaire    Feeling of Stress : Only a little  Social Connections: Socially Integrated (03/01/2023)   Received from [facility]   Social Network    How would you rate your social network  (family, work, friends)?: Good participation with social networks  Intimate Partner Violence: Not At Risk (03/01/2023)   Received from [facility]   HITS    Over the last 12 months how often did your partner physically hurt you?: 1    Over the last 12 months how often did your partner insult you or talk down to you?: 1    Over the last 12 months how often did your partner threaten you with physical harm?: 1    Over the last 12 months how often did your partner scream  or curse at you?: 1     Review of Systems    General:  No chills, fever, night sweats or weight changes.  Cardiovascular:  No chest pain, dyspnea on exertion, edema, orthopnea, palpitations, paroxysmal nocturnal dyspnea. Dermatological: No rash, lesions/masses Respiratory: No cough, dyspnea Urologic: No hematuria, dysuria Abdominal:   No nausea, vomiting, diarrhea, bright red blood per rectum, melena, or hematemesis Neurologic:  No visual changes, wkns, changes in mental status. All other systems reviewed and are otherwise negative except as noted above.  EKG Interpretation Date/Time:  Monday March 07 2023 08:09:57 EDT Ventricular Rate:  86 PR Interval:  148 QRS Duration:  98 QT Interval:  356 QTC Calculation: 426 R Axis:   60  Text Interpretation: Normal sinus rhythm Normal ECG When compared with ECG of 15-Nov-2021 23:35, PREVIOUS ECG IS PRESENT Confirmed by Joni Reining [phone]) on 03/07/2023 9:47:08 AM    Physical Exam    VS:  BP 116/72   Pulse 93   Ht 6\' 2"  (1.88 m)   Wt 213 lb 12.8 oz (97 kg)   SpO2 94%   BMI 27.45 kg/m  , BMI Body mass index is 27.45 kg/m.     GEN: Well nourished, well developed, in no acute distress. HEENT: normal. Neck: Supple, no JVD, carotid bruits, or masses. Cardiac: RRR, no murmurs, rubs, or gallops. No clubbing, cyanosis, edema.  Radials/DP/PT 2+ and equal bilaterally.  Respiratory:  Respirations regular and unlabored, clear to auscultation bilaterally. GI: Soft,  nontender, nondistended, BS + x 4. MS: no deformity or atrophy. Skin: warm and dry, no rash. Neuro:  Strength and sensation are intact. Psych: Normal affect.  EKG Interpretation Date/Time:  Monday March 07 2023 08:09:57 EDT Ventricular Rate:  86 PR Interval:  148 QRS Duration:  98 QT Interval:  356 QTC Calculation: 426 R Axis:   60  Text Interpretation: Normal sinus rhythm Normal ECG When compared with ECG of 15-Nov-2021 23:35, PREVIOUS ECG IS PRESENT Confirmed by Joni Reining [phone]) on 03/07/2023 9:47:08 AM   Lab Results  Component Value Date   WBC 10.7 (H) 11/15/2021   HGB 15.1 11/15/2021   HCT 43.6 11/15/2021   MCV 91.2 11/15/2021   PLT 294 11/15/2021   Lab Results  Component Value Date   CREATININE 1.05 11/15/2021   BUN 16 11/15/2021   NA 138 11/15/2021   K 3.6 11/15/2021   CL 102 11/15/2021   CO2 25 11/15/2021   Lab Results  Component Value Date   ALT 54 (H) 11/15/2021   AST 26 11/15/2021   ALKPHOS 51 11/15/2021   BILITOT 0.6 11/15/2021   Lab Results  Component Value Date   CHOL 222 (H) 12/04/2020   HDL 68.20 12/04/2020   LDLCALC 136 (H) 12/04/2020   TRIG 87.0 12/04/2020   CHOLHDL 3 12/04/2020    No results found for: "HGBA1C"   Review of Prior Studies EKG Interpretation Date/Time:  Monday March 07 2023 08:09:57 EDT Ventricular Rate:  86 PR Interval:  148 QRS Duration:  98 QT Interval:  356 QTC Calculation: 426 R Axis:   60  Text Interpretation: Normal sinus rhythm Normal ECG When compared with ECG of 15-Nov-2021 23:35, PREVIOUS ECG IS PRESENT Confirmed by Joni Reining [phone]) on 03/07/2023 9:47:08 AM  See scanned echo report from 10/06/2010.  Assessment & Plan   1.  Paroxysmal atrial fibrillation: The patient normally would have atrial fibrillation after alcohol use.  He has not had any alcohol use recently.  Was simply working out  in his yard moving his sprinkler around when his heart rate became very elevated and irregular.  Seen  at [facility] ED and admitted for 2 days with flecainide therapy and converted to normal sinus rhythm.  He now has "pill in the pocket" flecainide 100 mg to use as needed, and also continues to have metoprolol 25 mg as needed.  He is advised to use the flecainide first if he does not have any result from this metoprolol can be used but he is to report to ED.  Dr. Royann Shivers had discussed with him in the past and EP evaluation.  They held off on that as it was usually triggered by EtOH ingestion.  The patient had not had any EtOH prior to his most recent episode.  He is very physically active going to the gym every day with intense cardio workout.  I will refer him to EP for discussion and advisement to be established.  Atrial fibrillation can be discussed at their discretion.  Will defer to EP concerning recommendations or any follow-up testing.  2.  Hypercholesterolemia: Labs are followed by PCP.  On review of care everywhere I do not see a recent lipid panel.  Recent labs from 2022 revealing cholesterol 222, LDL of 136, HDL 68.2.  This should be completed on follow-up if not completed by PCP.  Patient currently is not on statin therapy.         Signed, Bettey Mare. Liborio Nixon, ANP, AACC   03/07/2023 9:47 AM      Office [phone] Fax [phone]  Notice: This dictation was prepared with Dragon dictation along with smaller phrase technology. Any transcriptional errors that result from this process are unintentional and may not be corrected upon review.

## 2023-03-07 ENCOUNTER — Encounter: Payer: Self-pay | Admitting: Adult Health

## 2023-03-07 ENCOUNTER — Ambulatory Visit: Payer: Managed Care, Other (non HMO) | Attending: Adult Health | Admitting: Adult Health

## 2023-03-07 VITALS — BP 116/72 | HR 93 | Ht 74.0 in | Wt 213.8 lb

## 2023-03-07 DIAGNOSIS — E7800 Pure hypercholesterolemia, unspecified: Secondary | ICD-10-CM

## 2023-03-07 DIAGNOSIS — I480 Paroxysmal atrial fibrillation: Secondary | ICD-10-CM

## 2023-03-07 NOTE — Patient Instructions (Signed)
Medication Instructions:  No Changes *If you need a refill on your cardiac medications before your next appointment, please call your pharmacy*   Lab Work: No Labs If you have labs (blood work) drawn today and your tests are completely normal, you will receive your results only by: MyChart Message (if you have MyChart) OR A paper copy in the mail If you have any lab test that is abnormal or we need to change your treatment, we will call you to review the results.   Testing/Procedures: No Testing   Follow-Up: At [facility], you and your health needs are our priority.  As part of our continuing mission to provide you with exceptional heart care, we have created designated Provider Care Teams.  These Care Teams include your primary Cardiologist (physician) and Advanced Practice Providers (APPs -  Physician Assistants and Nurse Practitioners) who all work together to provide you with the care you need, when you need it.  We recommend signing up for the patient portal called "MyChart".  Sign up information is provided on this After Visit Summary.  MyChart is used to connect with patients for Virtual Visits (Telemedicine).  Patients are able to view lab/test results, encounter notes, upcoming appointments, etc.  Non-urgent messages can be sent to your provider as well.   To learn more about what you can do with MyChart, go to [email or web address].    Your next appointment:   Keep Scheduled Appointment  Provider:   Thurmon Fair, MD

## 2023-04-11 ENCOUNTER — Encounter: Payer: Self-pay | Admitting: Cardiology

## 2023-04-11 ENCOUNTER — Encounter: Payer: Self-pay | Admitting: Cardiovascular Disease

## 2023-04-11 ENCOUNTER — Ambulatory Visit: Payer: Managed Care, Other (non HMO) | Attending: Cardiovascular Disease | Admitting: Cardiovascular Disease

## 2023-04-11 ENCOUNTER — Ambulatory Visit (INDEPENDENT_AMBULATORY_CARE_PROVIDER_SITE_OTHER): Payer: Managed Care, Other (non HMO) | Admitting: Cardiology

## 2023-04-11 VITALS — BP 104/78 | HR 68 | Ht 74.0 in | Wt 220.0 lb

## 2023-04-11 VITALS — BP 116/82 | HR 72 | Ht 74.0 in | Wt 217.6 lb

## 2023-04-11 DIAGNOSIS — I480 Paroxysmal atrial fibrillation: Secondary | ICD-10-CM | POA: Diagnosis not present

## 2023-04-11 MED ORDER — APIXABAN 5 MG PO TABS: 5.0000 mg | ORAL_TABLET | Freq: Two times a day (BID) | ORAL | 0 refills | Status: DC

## 2023-04-11 NOTE — Progress Notes (Signed)
Electrophysiology Office Note:   Date:  04/11/2023  ID:  [name], DOB 05/09/1986, MRN [id number]  Primary Cardiologist: Thurmon Fair, MD Electrophysiologist: Regan Lemming, MD      History of Present Illness:   [name] is a 37 y.o. male with h/o atrial fibrillation seen today for  for Electrophysiology evaluation of atrial fibrillation at the request of Mihai Croitoru.    He has a history of atrial fibrillation.  He has infrequent episodes.  They were previously triggered by alcohol.  His most recent episode was not triggered.  He had worked out the morning of the episode.  He then was working in his yard, moving a sprinkler and went into atrial fibrillation.  His episode lasted a few days.  He went to [facility] and took 150 mg of flecainide which did not convert him to normal rhythm.  He took 300 mg the next day which converted him to atrial flutter and then to normal rhythm.  Since then, he has done well.  He has noted no further episodes of atrial fibrillation.  Review of systems complete and found to be negative unless listed in HPI.   EP Information / Studies Reviewed:    EKG is not ordered today. EKG from 04/11/23 reviewed which showed sinus rhythm        Risk Assessment/Calculations:    CHA2DS2-VASc Score = 0   This indicates a 0.2% annual risk of stroke. The patient's score is based upon: CHF History: 0 HTN History: 0 Diabetes History: 0 Stroke History: 0 Vascular Disease History: 0 Age Score: 0 Gender Score: 0              Physical Exam:   VS:  BP 116/82 (BP Location: Left Arm, Patient Position: Sitting, Cuff Size: Large)   Pulse 72   Ht 6\' 2"  (1.88 m)   Wt 217 lb 9.6 oz (98.7 kg)   SpO2 99%   BMI 27.94 kg/m    Wt Readings from Last 3 Encounters:  04/11/23 217 lb 9.6 oz (98.7 kg)  04/11/23 220 lb (99.8 kg)  03/07/23 213 lb 12.8 oz (97 kg)     GEN: Well nourished, well developed in no acute distress NECK: No JVD; No  carotid bruits CARDIAC: Regular rate and rhythm, no murmurs, rubs, gallops RESPIRATORY:  Clear to auscultation without rales, wheezing or rhonchi  ABDOMEN: Soft, non-tender, non-distended EXTREMITIES:  No edema; No deformity   ASSESSMENT AND PLAN:    1.  Paroxysmal atrial fibrillation: Has had multiple short episodes, usually triggered by alcohol.  Not triggered on his most recent episode.  He would prefer an alternative rhythm control strategy to pill in the pocket flecainide.  Due to that, we [place] plan for ablation.  Risk and benefits have been discussed.  He understands the risks and is agreed to the procedure.  Peaches Vanoverbeke start Eliquis prior to ablation.  He is discussed with referring cardiologist  Risk, benefits, and alternatives to EP study and radiofrequency/pulse field ablation for afib were also discussed in detail today. These risks include but are not limited to stroke, bleeding, vascular damage, tamponade, perforation, damage to the esophagus, lungs, and other structures, pulmonary vein stenosis, worsening renal function, and death. The patient understands these risk and wishes to proceed.  We Ronia Hazelett therefore proceed with catheter ablation at the next available time.  Carto, ICE, anesthesia are requested for the procedure.  Jozie Wulf also obtain CT PV protocol prior to the procedure to exclude LAA  thrombus and further evaluate atrial anatomy.  Follow up with Dr. Elberta Fortis as usual post procedure  Signed, Winfield Caba Jorja Loa, MD

## 2023-04-11 NOTE — Progress Notes (Signed)
Cardiology Office Note:    Date:  04/11/2023   ID:  [name], DOB [birth year], MRN [id number]  PCP:  Shade Flood, MD   [facility] Providers Cardiologist:  Thurmon Fair, MD     Referring MD: Shade Flood, MD   No chief complaint on file.   History of Present Illness:    [name] is a 37 y.o. male with a hx of infrequent paroxysmal atrial fibrillation who was recently hospitalized with atrial fibrillation with rapid ventricular response at [facility], converting to sinus rhythm after 2 doses of flecainide.  In the past all his episodes of atrial fibrillation had been associated with alcohol consumption, but this time he had not had anything to drink.  He had just been setting up a sprinkler in the backyard.  He has an appointment with Dr. Elberta Fortis this afternoon to discuss antiarrhythmic therapy versus ablation.  He is on aspirin.  He does not take any daily antiarrhythmics, but has metoprolol and flecainide available as needed.  Otherwise he feels well.  He continues to exercise at the gym 6 out of 7 days a week, sometimes going for 2 workouts in a day.  He never has arrhythmia during exercise.  He denies exertional angina or dyspnea, dizziness or syncope, palpitations.  Past Medical History:  Diagnosis Date   A-fib (HCC)    Heart murmur    Mitral valve prolapse    Paroxysmal atrial fibrillation (HCC)    Testosterone deficiency     Past Surgical History:  Procedure Laterality Date   HIP SURGERY     NM MYOCAR PERF WALL MOTION  12/11/2004   [place], [place]: no ischemia   SURGERY SCROTAL / TESTICULAR  06/1994   torsion   US ECHOCARDIOGRAPHY  10/06/2010   [place] mildly dilated,trace MR   WRIST SURGERY  05/2004   tendon repair    Current Medications: Current Meds  Medication Sig   aspirin EC 81 MG tablet Take 81 mg by mouth daily. Swallow whole.   Melatonin 5 MG CHEW Chew 5-10 mg by mouth at bedtime as needed (sleep).   multivitamin (ONE-A-DAY MEN'S)  TABS tablet Take 1 tablet by mouth daily.     Allergies:   Patient has no known allergies.   Social History   Socioeconomic History   Marital status: Married    Spouse name: Not on file   Number of children: Not on file   Years of education: Not on file   Highest education level: Not on file  Occupational History   Not on file  Tobacco Use   Smoking status: Former    Types: Cigars   Smokeless tobacco: Never  Vaping Use   Vaping status: Never Used  Substance and Sexual Activity   Alcohol use: Yes    Comment: social   Drug use: No   Sexual activity: Yes    Birth control/protection: None  Other Topics Concern   Not on file  Social History Narrative   Not on file   Social Determinants of Health   Financial Resource Strain: Low Risk  (03/01/2023)   Received from [facility]   Overall Financial Resource Strain (CARDIA)    Difficulty of Paying Living Expenses: Not hard at all  Food Insecurity: No Food Insecurity (03/01/2023)   Received from [facility]   Hunger Vital Sign    Worried About Running Out of Food in the Last Year: Never true    Ran Out of Food in the Last  Year: Never true  Transportation Needs: No Transportation Needs (03/01/2023)   Received from [facility] - Transportation    Lack of Transportation (Medical): No    Lack of Transportation (Non-Medical): No  Physical Activity: Sufficiently Active (03/01/2023)   Received from [facility]   Exercise Vital Sign    Days of Exercise per Week: 6 days    Minutes of Exercise per Session: 50 min  Stress: No Stress Concern Present (03/01/2023)   Received from [facility] of [facility]ational [facility] - Occupational Stress Questionnaire    Feeling of Stress : Only a little  Social Connections: Socially Integrated (03/01/2023)   Received from [facility]   Social Network    How would you rate your social network (family, work, friends)?: Good participation with social networks      Family History: The patient's family history includes Cancer in his maternal grandmother, mother, paternal grandfather, and paternal grandmother; Diabetes in his father; Hyperlipidemia in his father; Hypertension in his father.  ROS:   Please see the history of present illness.     All other systems reviewed and are negative.  EKGs/Labs/Other Studies Reviewed:    The following studies were reviewed today: Transesophageal echo [facility] 07/06/2021 Impression  Left Ventricle: Systolic function is low normal. EF: 50-55%.    Right Ventricle: Systolic function is normal.    Left Atrium: No left atrial or left atrial appendage thrombus.    EKG:    EKG Interpretation Date/Time:  Monday April 11 2023 09:19:21 EST Ventricular Rate:  68 PR Interval:  176 QRS Duration:  88 QT Interval:  374 QTC Calculation: 397 R Axis:   22  Text Interpretation: Normal sinus rhythm Normal ECG When compared with ECG of 07-Mar-2023 08:09, No significant change was found Confirmed by Simcha Speir ([id number]) on 04/11/2023 9:24:04 AM         Recent Labs: No results found for requested labs within last 365 days.   07/06/2021 potassium 4.0, creatinine 1.00, normal liver function tests hemoglobin A1c 5.2% normal cardiac enzymes, negative UDS normal TSH 1.46 Recent Lipid Panel    Component Value Date/Time   CHOL 222 (H) 12/04/2020 0838   CHOL 171 08/15/2018 0849   TRIG 87.0 12/04/2020 0838   HDL 68.20 12/04/2020 0838   HDL 57 08/15/2018 0849   CHOLHDL 3 12/04/2020 0838   VLDL 17.4 12/04/2020 0838   LDLCALC 136 (H) 12/04/2020 0838   LDLCALC 100 (H) 08/15/2018 0849     Risk Assessment/Calculations:    CHA2DS2-VASc Score =     This indicates a  % annual risk of stroke. The patient's score is based upon:             Physical Exam:    VS:  BP 104/78 (BP Location: Left Arm, Patient Position: Sitting, Cuff Size: Large)   Pulse 68   Ht 6\' 2"  (1.88 m)   Wt 220 lb (99.8 kg)   SpO2 98%    BMI 28.25 kg/m     Wt Readings from Last 3 Encounters:  04/11/23 220 lb (99.8 kg)  03/07/23 213 lb 12.8 oz (97 kg)  12/23/21 229 lb (103.9 kg)     GEN: Mildly overweight, well nourished, well developed in no acute distress HEENT: Normal NECK: No JVD; No carotid bruits LYMPHATICS: No lymphadenopathy CARDIAC: Bradycardic, RRR, no murmurs, rubs, gallops RESPIRATORY:  Clear to auscultation without rales, wheezing or rhonchi  ABDOMEN: Soft, non-tender, non-distended MUSCULOSKELETAL:  No edema; No  deformity  SKIN: Warm and dry NEUROLOGIC:  Alert and oriented x 3 PSYCHIATRIC:  Normal affect   ASSESSMENT:    1. Paroxysmal atrial fibrillation (HCC)    PLAN:    In order of problems listed above:  Paroxysmal atrial fibrillation: He appears to have lone atrial fibrillation, with his first event occurring more than 10 years ago when he was in his mid 72s.  He had a good response to flecainide, albeit rather delayed (converted several hours after 2 doses of flecainide several hours apart).  She prefers not to take any daily medications.  He has an appointment this afternoon with Dr. Elberta Fortis to discuss ablation versus alternative antiarrhythmic therapy.          Medication Adjustments/Labs and Tests Ordered: Current medicines are reviewed at length with the patient today.  Concerns regarding medicines are outlined above.  Orders Placed This Encounter  Procedures   EKG 12-Lead   No orders of the defined types were placed in this encounter.   There are no Patient Instructions on file for this visit.   Signed, Thurmon Fair, MD  04/11/2023 9:34 AM    [facility]

## 2023-04-11 NOTE — Patient Instructions (Signed)

## 2023-04-11 NOTE — Patient Instructions (Signed)
Medication Instructions:  Your physician has recommended you make the following change in your medication:  START Eliquis 5 mg twice daily -- YOU WILL START THIS ONE MONTH PRIOR TO PROCEDURE  *If you need a refill on your cardiac medications before your next appointment, please call your pharmacy*   Lab Work: Pre procedure labs -- we will call you to schedule:  BMP & CBC  If you have a lab test that is abnormal and we need to change your treatment, we will call you to review the results -- otherwise no news is good news.    Testing/Procedures: Your physician has requested that you have cardiac CT 1 month PRIOR to your ablation. Cardiac computed tomography (CT) is a painless test that uses an x-ray machine to take clear, detailed pictures of your heart.  We will call you to schedule.  Your physician has recommended that you have an ablation. Catheter ablation is a medical procedure used to treat some cardiac arrhythmias (irregular heartbeats). During catheter ablation, a long, thin, flexible tube is put into a blood vessel in your groin (upper thigh), or neck. This tube is called an ablation catheter. It is then guided to your heart through the blood vessel. Radio frequency waves destroy small areas of heart tissue where abnormal heartbeats may cause an arrhythmia to start.   Your ablation is scheduled for 08/04/2023. Please arrive at [facility] at 5:30 am.  We will call you for further instructions.   Follow-Up: At [facility], you and your health needs are our priority.  As part of our continuing mission to provide you with exceptional heart care, we have created designated Provider Care Teams.  These Care Teams include your primary Cardiologist (physician) and Advanced Practice Providers (APPs -  Physician Assistants and Nurse Practitioners) who all work together to provide you with the care you need, when you need it.  Your next appointment:   1 month(s) after your  ablation  The format for your next appointment:   In Person  Provider:   AFib clinic   Thank you for choosing [facility]!!   Dory Horn, RN [phone]    Other Instructions   Cardiac Ablation Cardiac ablation is a procedure to destroy (ablate) some heart tissue that is sending bad signals. These bad signals cause problems in heart rhythm. The heart has many areas that make these signals. If there are problems in these areas, they can make the heart beat in a way that is not normal. Destroying some tissues can help make the heart rhythm normal. Tell your doctor about: Any allergies you have. All medicines you are taking. These include vitamins, herbs, eye drops, creams, and over-the-counter medicines. Any problems you or family members have had with medicines that make you fall asleep (anesthetics). Any blood disorders you have. Any surgeries you have had. Any medical conditions you have, such as kidney failure. Whether you are pregnant or may be pregnant. What are the risks? This is a safe procedure. But problems may occur, including: Infection. Bruising and bleeding. Bleeding into the chest. Stroke or blood clots. Damage to nearby areas of your body. Allergies to medicines or dyes. The need for a pacemaker if the normal system is damaged. Failure of the procedure to treat the problem. What happens before the procedure? Medicines Ask your doctor about: Changing or stopping your normal medicines. This is important. Taking aspirin and ibuprofen. Do not take these medicines unless your doctor tells you to take them.  Taking other medicines, vitamins, herbs, and supplements. General instructions Follow instructions from your doctor about what you cannot eat or drink. Plan to have someone take you home from the hospital or clinic. If you will be going home right after the procedure, plan to have someone with you for 24 hours. Ask your doctor what steps will be  taken to prevent infection. What happens during the procedure?  An IV tube will be put into one of your veins. You will be given a medicine to help you relax. The skin on your neck or groin will be numbed. A cut (incision) will be made in your neck or groin. A needle will be put through your cut and into a large vein. A tube (catheter) will be put into the needle. The tube will be moved to your heart. Dye may be put through the tube. This helps your doctor see your heart. Small devices (electrodes) on the tube will send out signals. A type of energy will be used to destroy some heart tissue. The tube will be taken out. Pressure will be held on your cut. This helps stop bleeding. A bandage will be put over your cut. The exact procedure may vary among doctors and hospitals. What happens after the procedure? You will be watched until you leave the hospital or clinic. This includes checking your heart rate, breathing rate, oxygen, and blood pressure. Your cut will be watched for bleeding. You will need to lie still for a few hours. Do not drive for 24 hours or as long as your doctor tells you. Summary Cardiac ablation is a procedure to destroy some heart tissue. This is done to treat heart rhythm problems. Tell your doctor about any medical conditions you may have. Tell him or her about all medicines you are taking to treat them. This is a safe procedure. But problems may occur. These include infection, bruising, bleeding, and damage to nearby areas of your body. Follow what your doctor tells you about food and drink. You may also be told to change or stop some of your medicines. After the procedure, do not drive for 24 hours or as long as your doctor tells you. This information is not intended to replace advice given to you by your health care provider. Make sure you discuss any questions you have with your health care provider. Document Revised: 07/31/2021 Document Reviewed:  04/12/2019 Elsevier Patient Education  2023 Elsevier Inc.   Cardiac Ablation, Care After  This sheet gives you information about how to care for yourself after your procedure. Your health care provider may also give you more specific instructions. If you have problems or questions, contact your health care provider. What can I expect after the procedure? After the procedure, it is common to have: Bruising around your puncture site. Tenderness around your puncture site. Skipped heartbeats. If you had an atrial fibrillation ablation, you may have atrial fibrillation during the first several months after your procedure.  Tiredness (fatigue).  Follow these instructions at home: Puncture site care  Follow instructions from your health care provider about how to take care of your puncture site. Make sure you: If present, leave stitches (sutures), skin glue, or adhesive strips in place. These skin closures may need to stay in place for up to 2 weeks. If adhesive strip edges start to loosen and curl up, you may trim the loose edges. Do not remove adhesive strips completely unless your health care provider tells you to do that. If a  large square bandage is present, this may be removed 24 hours after surgery.  Check your puncture site every day for signs of infection. Check for: Redness, swelling, or pain. Fluid or blood. If your puncture site starts to bleed, lie down on your back, apply firm pressure to the area, and contact your health care provider. Warmth. Pus or a bad smell. A pea or small marble sized lump at the site is normal and can take up to three months to resolve.  Driving Do not drive for at least 4 days after your procedure or however long your health care provider recommends. (Do not resume driving if you have previously been instructed not to drive for other health reasons.) Do not drive or use heavy machinery while taking prescription pain medicine. Activity Avoid activities  that take a lot of effort for at least 7 days after your procedure. Do not lift anything that is heavier than 5 lb (4.5 kg) for one week.  No sexual activity for 1 week.  Return to your normal activities as told by your health care provider. Ask your health care provider what activities are safe for you. General instructions Take over-the-counter and prescription medicines only as told by your health care provider. Do not use any products that contain nicotine or tobacco, such as cigarettes and e-cigarettes. If you need help quitting, ask your health care provider. You may shower after 24 hours, but Do not take baths, swim, or use a hot tub for 1 week.  Do not drink alcohol for 24 hours after your procedure. Keep all follow-up visits as told by your health care provider. This is important. Contact a health care provider if: You have redness, mild swelling, or pain around your puncture site. You have fluid or blood coming from your puncture site that stops after applying firm pressure to the area. Your puncture site feels warm to the touch. You have pus or a bad smell coming from your puncture site. You have a fever. You have chest pain or discomfort that spreads to your neck, jaw, or arm. You have chest pain that is worse with lying on your back or taking a deep breath. You are sweating a lot. You feel nauseous. You have a fast or irregular heartbeat. You have shortness of breath. You are dizzy or light-headed and feel the need to lie down. You have pain or numbness in the arm or leg closest to your puncture site. Get help right away if: Your puncture site suddenly swells. Your puncture site is bleeding and the bleeding does not stop after applying firm pressure to the area. These symptoms may represent a serious problem that is an emergency. Do not wait to see if the symptoms will go away. Get medical help right away. Call your local emergency services (911 in the [place] Do not drive  yourself to the hospital. Summary After the procedure, it is normal to have bruising and tenderness at the puncture site in your groin, neck, or forearm. Check your puncture site every day for signs of infection. Get help right away if your puncture site is bleeding and the bleeding does not stop after applying firm pressure to the area. This is a medical emergency. This information is not intended to replace advice given to you by your health care provider. Make sure you discuss any questions you have with your health care provider.

## 2023-04-11 NOTE — Addendum Note (Signed)
Addended by: Baird Lyons on: 04/11/2023 04:24 PM   Modules accepted: Orders

## 2023-05-11 ENCOUNTER — Encounter: Payer: Self-pay | Admitting: Cardiology

## 2023-06-14 ENCOUNTER — Telehealth: Payer: Self-pay | Admitting: *Deleted

## 2023-06-14 DIAGNOSIS — I480 Paroxysmal atrial fibrillation: Secondary | ICD-10-CM

## 2023-06-14 MED ORDER — APIXABAN 5 MG PO TABS: 5.0000 mg | ORAL_TABLET | Freq: Two times a day (BID) | ORAL | 6 refills | Status: DC

## 2023-06-14 NOTE — Telephone Encounter (Signed)
Ablation date is 3/13.   Aware office will be in touch to go over instructions. Advised to start Eliquis on 2/14, pt agreeable.  Sent in new Rx. Patient verbalized understanding and agreeable to plan.    Forwarding to EP Schedulers to arrange mychart video visit with Dr. Elberta Fortis this month or next month  (wife was not able to make it to last appt and would like to go over ablation and ask any questions)

## 2023-07-05 ENCOUNTER — Other Ambulatory Visit: Payer: Self-pay

## 2023-07-05 DIAGNOSIS — I480 Paroxysmal atrial fibrillation: Secondary | ICD-10-CM

## 2023-07-06 ENCOUNTER — Telehealth: Payer: Self-pay

## 2023-07-06 ENCOUNTER — Encounter: Payer: Self-pay | Admitting: Cardiology

## 2023-07-06 NOTE — Telephone Encounter (Signed)
Spoke with patient, instruction letters for CT and ablation sent via MyChart. Patient to have labs completed 2/17 or 2/18. Confirmed patient will start eliquis on 2/14.

## 2023-07-06 NOTE — Telephone Encounter (Signed)
Apologized to pt for no one getting back with him yet to arrange appt. Offered tomorrow/next week but he said he does not think they will be able to do.  He is going to get a list of questions together and send via mychart for MD to respond/answer. He appreciates my call.

## 2023-07-07 ENCOUNTER — Telehealth (HOSPITAL_COMMUNITY): Payer: Self-pay

## 2023-07-07 NOTE — Telephone Encounter (Signed)
Spoke with patient to complete one month pre-procedure call.     New medical conditions?  NO Recent hospitalizations or surgeries? NO Started any new medications? NO Patient made aware to contact office to inform of any new medications started. Any changes in activities of daily living? NO  Pre-procedure testing scheduled: CT on 07/14/23 and lab work ordered. Confirmed patient will start taking ELIQUIS tomorrow and will continue taking medication before procedure or it may need to be rescheduled.  Confirmed patient is scheduled for Atrial Fibrillation Ablation on Thursday, March 13 with Dr. Loman Brooklyn. Instructed patient to arrive at the Main Entrance A at [facility]: [address] [place], [place] [address] and check in at Admitting at 5:30 AM.  Advised of plan to go home the same day and will only stay overnight if medically necessary. You MUST have a responsible adult to drive you home and MUST be with you the first 24 hours after you arrive home or your procedure could be cancelled.  Patient sent a MyChart message with multiple questions regarding procedure/post-procedure. Several activity/dietary questions were answered. He is agreeable to a virtual visit with provider, to include his wife to address remaining questions regarding risks or additional concerns.    Patient verbalized understanding to information provided and is agreeable to proceed with procedure.

## 2023-07-12 LAB — BASIC METABOLIC PANEL
BUN/Creatinine Ratio: 20 (ref 9–20)
BUN: 25 mg/dL — ABNORMAL HIGH (ref 6–20)
CO2: 21 mmol/L (ref 20–29)
Calcium: 10.4 mg/dL — ABNORMAL HIGH (ref 8.7–10.2)
Chloride: 100 mmol/L (ref 96–106)
Creatinine, Ser: 1.22 mg/dL (ref 0.76–1.27)
Glucose: 99 mg/dL (ref 70–99)
Potassium: 5.1 mmol/L (ref 3.5–5.2)
Sodium: 138 mmol/L (ref 134–144)
eGFR: 78 mL/min/{1.73_m2} (ref >59)

## 2023-07-12 LAB — CBC
Hematocrit: 48 % (ref 37.5–51.0)
Hemoglobin: 16.2 g/dL (ref 13.0–17.7)
MCH: 30.8 pg (ref 26.6–33.0)
MCHC: 33.8 g/dL (ref 31.5–35.7)
MCV: 91 fL (ref 79–97)
Platelets: 281 10*3/uL (ref 150–450)
RBC: 5.26 x10E6/uL (ref 4.14–5.80)
RDW: 12.8 % (ref 11.6–15.4)
WBC: 12.6 10*3/uL — ABNORMAL HIGH (ref 3.4–10.8)

## 2023-07-14 ENCOUNTER — Ambulatory Visit (HOSPITAL_COMMUNITY)
Admission: RE | Admit: 2023-07-14 | Discharge: 2023-07-14 | Disposition: A | Discharge status: Home / Self Care | Payer: Managed Care, Other (non HMO) | Source: Ambulatory Visit | Attending: Cardiology | Admitting: Cardiology

## 2023-07-14 DIAGNOSIS — I480 Paroxysmal atrial fibrillation: Secondary | ICD-10-CM | POA: Insufficient documentation

## 2023-07-14 MED ORDER — IOHEXOL 350 MG/ML SOLN
100.0000 mL | Freq: Once | INTRAVENOUS | Status: AC | PRN
  Administered 2023-07-14: 100 mL via INTRAVENOUS

## 2023-07-21 ENCOUNTER — Encounter: Payer: Self-pay | Admitting: Cardiology

## 2023-07-28 ENCOUNTER — Telehealth (HOSPITAL_COMMUNITY): Payer: Self-pay

## 2023-07-28 NOTE — Telephone Encounter (Signed)
 Call placed to patient to discuss upcoming procedure.   CT: completed and acceptable.  Labs: completed and acceptable.   Any recent signs of acute illness or been started on antibiotics? No Any diabetic medications to hold?  No Any missed doses of blood thinner?  No Advised patient to continue taking ANTICOAGULANT: Eliquis (Apixaban) without missing any doses.  Medication instructions:  On the morning of your procedure DO NOT take any medication., including Eliq or the procedure may be rescheduled. Nothing to eat or drink after midnight prior to your procedure.  Confirmed patient is scheduled for Atrial Fibrillation Ablation on Thursday, March 13 with Dr. Loman Brooklyn. Instructed patient to arrive at the Main Entrance A at [facility]: [address] [place], [place] [address] and check in at Admitting at 5:30 AM  Advised of plan to go home the same day and will only stay overnight if medically necessary. You MUST have a responsible adult to drive you home and MUST be with you the first 24 hours after you arrive home or your procedure could be cancelled.  Patient verbalized understanding to all instructions provided and agreed to proceed with procedure.

## 2023-08-03 NOTE — Pre-Procedure Instructions (Signed)
 Attempted to call patient regarding procedure instructions for tomorrow.  Left voicemail on the following items: Arrival time 0515 Nothing to eat or drink after midnight No meds AM of procedure Responsible person to drive you home and stay with you for 24 hrs  Have you missed any doses of anti-coagulant Elqiuis- should be taken twice a day, if you have missed any doses.  Don't take dose on morning of procedure.

## 2023-08-04 ENCOUNTER — Encounter (HOSPITAL_COMMUNITY)
Admission: RE | Disposition: A | Discharge status: Home / Self Care | Payer: Self-pay | Source: Ambulatory Visit | Attending: Cardiology

## 2023-08-04 ENCOUNTER — Ambulatory Visit (HOSPITAL_COMMUNITY)
Admission: RE | Admit: 2023-08-04 | Discharge: 2023-08-04 | Disposition: A | Discharge status: Home / Self Care | Payer: Managed Care, Other (non HMO) | Source: Ambulatory Visit | Attending: Cardiology | Admitting: Cardiology

## 2023-08-04 ENCOUNTER — Other Ambulatory Visit: Payer: Self-pay

## 2023-08-04 ENCOUNTER — Ambulatory Visit (HOSPITAL_BASED_OUTPATIENT_CLINIC_OR_DEPARTMENT_OTHER): Admitting: Anesthesiology

## 2023-08-04 ENCOUNTER — Ambulatory Visit (HOSPITAL_COMMUNITY): Admitting: Anesthesiology

## 2023-08-04 DIAGNOSIS — Z79899 Other long term (current) drug therapy: Secondary | ICD-10-CM | POA: Diagnosis not present

## 2023-08-04 DIAGNOSIS — I4891 Unspecified atrial fibrillation: Secondary | ICD-10-CM | POA: Diagnosis not present

## 2023-08-04 DIAGNOSIS — I480 Paroxysmal atrial fibrillation: Secondary | ICD-10-CM | POA: Insufficient documentation

## 2023-08-04 HISTORY — PX: ATRIAL FIBRILLATION ABLATION: EP1191

## 2023-08-04 LAB — POCT ACTIVATED CLOTTING TIME: Activated Clotting Time: 268 s

## 2023-08-04 SURGERY — ATRIAL FIBRILLATION ABLATION
Anesthesia: General

## 2023-08-04 MED ORDER — PROPOFOL 10 MG/ML IV BOLUS
INTRAVENOUS | Status: DC | PRN
  Administered 2023-08-04: 200 mg via INTRAVENOUS

## 2023-08-04 MED ORDER — SODIUM CHLORIDE 0.9% FLUSH: 3.0000 mL | INTRAVENOUS | Status: DC | PRN

## 2023-08-04 MED ORDER — ACETAMINOPHEN 325 MG PO TABS: 650.0000 mg | ORAL_TABLET | ORAL | Status: DC | PRN

## 2023-08-04 MED ORDER — HEPARIN SODIUM (PORCINE) 1000 UNIT/ML IJ SOLN
INTRAMUSCULAR | Status: DC | PRN
  Administered 2023-08-04: 15000 [IU] via INTRAVENOUS
  Administered 2023-08-04: 6000 [IU] via INTRAVENOUS

## 2023-08-04 MED ORDER — FENTANYL CITRATE (PF) 250 MCG/5ML IJ SOLN
INTRAMUSCULAR | Status: DC | PRN
  Administered 2023-08-04 (×2): 50 ug via INTRAVENOUS

## 2023-08-04 MED ORDER — ATROPINE SULFATE 1 MG/ML IV SOLN
INTRAVENOUS | Status: DC | PRN
  Administered 2023-08-04: 1 mg via INTRAVENOUS

## 2023-08-04 MED ORDER — HEPARIN (PORCINE) IN NACL 1000-0.9 UT/500ML-% IV SOLN
INTRAVENOUS | Status: DC | PRN
  Administered 2023-08-04 (×4): 500 mL

## 2023-08-04 MED ORDER — SODIUM CHLORIDE 0.9 % IV SOLN: INTRAVENOUS | Status: DC

## 2023-08-04 MED ORDER — SUGAMMADEX SODIUM 200 MG/2ML IV SOLN
INTRAVENOUS | Status: DC | PRN
  Administered 2023-08-04: 200 mg via INTRAVENOUS

## 2023-08-04 MED ORDER — SODIUM CHLORIDE 0.9 % IV SOLN: 250.0000 mL | INTRAVENOUS | Status: DC | PRN

## 2023-08-04 MED ORDER — ONDANSETRON HCL 4 MG/2ML IJ SOLN: 4.0000 mg | Freq: Four times a day (QID) | INTRAMUSCULAR | Status: DC | PRN

## 2023-08-04 MED ORDER — PROTAMINE SULFATE 10 MG/ML IV SOLN
INTRAVENOUS | Status: DC | PRN
  Administered 2023-08-04: 40 mg via INTRAVENOUS

## 2023-08-04 MED ORDER — DEXAMETHASONE SODIUM PHOSPHATE 10 MG/ML IJ SOLN
INTRAMUSCULAR | Status: DC | PRN
  Administered 2023-08-04: 10 mg via INTRAVENOUS

## 2023-08-04 MED ORDER — ROCURONIUM BROMIDE 10 MG/ML (PF) SYRINGE
PREFILLED_SYRINGE | INTRAVENOUS | Status: DC | PRN
Start: 2023-08-04 — End: 2023-08-04
  Administered 2023-08-04: 30 mg via INTRAVENOUS
  Administered 2023-08-04: 20 mg via INTRAVENOUS
  Administered 2023-08-04: 50 mg via INTRAVENOUS

## 2023-08-04 MED ORDER — LIDOCAINE 2% (20 MG/ML) 5 ML SYRINGE
INTRAMUSCULAR | Status: DC | PRN
  Administered 2023-08-04: 60 mg via INTRAVENOUS

## 2023-08-04 MED ORDER — ONDANSETRON HCL 4 MG/2ML IJ SOLN
INTRAMUSCULAR | Status: DC | PRN
  Administered 2023-08-04: 4 mg via INTRAVENOUS

## 2023-08-04 MED ORDER — PHENYLEPHRINE 80 MCG/ML (10ML) SYRINGE FOR IV PUSH (FOR BLOOD PRESSURE SUPPORT)
PREFILLED_SYRINGE | INTRAVENOUS | Status: DC | PRN
Start: 2023-08-04 — End: 2023-08-04
  Administered 2023-08-04: 160 ug via INTRAVENOUS

## 2023-08-04 MED ORDER — PROPOFOL 500 MG/50ML IV EMUL
INTRAVENOUS | Status: DC | PRN
Start: 2023-08-04 — End: 2023-08-04
  Administered 2023-08-04: 70 ug/kg/min via INTRAVENOUS

## 2023-08-04 MED ORDER — FENTANYL CITRATE (PF) 100 MCG/2ML IJ SOLN
INTRAMUSCULAR | Status: AC
  Filled 2023-08-04: qty 2

## 2023-08-04 MED ORDER — SODIUM CHLORIDE 0.9% FLUSH: 3.0000 mL | Freq: Two times a day (BID) | INTRAVENOUS | Status: DC

## 2023-08-04 SURGICAL SUPPLY — 22 items
BAG SNAP BAND KOVER 36X36 (MISCELLANEOUS) IMPLANT
CABLE PFA RX CATH CONN (CABLE) IMPLANT
CATH 8FR REPROCESSED SOUNDSTAR (CATHETERS) ×1 IMPLANT
CATH 8FR SOUNDSTAR REPROCESSED (CATHETERS) IMPLANT
CATH FARAWAVE ABLATION 31 (CATHETERS) IMPLANT
CATH OCTARAY 2.0 F 3-3-3-3-3 (CATHETERS) IMPLANT
CATH WEB BI DIR CSDF CRV REPRO (CATHETERS) IMPLANT
CLOSURE MYNX CONTROL 6F/7F (Vascular Products) IMPLANT
CLOSURE PERCLOSE PROSTYLE (VASCULAR PRODUCTS) IMPLANT
COVER SWIFTLINK CONNECTOR (BAG) ×1 IMPLANT
DILATOR VESSEL 38 20CM 16FR (INTRODUCER) IMPLANT
GUIDEWIRE INQWIRE 1.5J.035X260 (WIRE) IMPLANT
INQWIRE 1.5J .035X260CM (WIRE) ×1 IMPLANT
KIT VERSACROSS CNCT FARADRIVE (KITS) IMPLANT
MAT PREVALON FULL STRYKER (MISCELLANEOUS) IMPLANT
PACK EP LF (CUSTOM PROCEDURE TRAY) ×1 IMPLANT
PAD DEFIB RADIO PHYSIO CONN (PAD) ×1 IMPLANT
PATCH CARTO3 (PAD) IMPLANT
SHEATH FARADRIVE STEERABLE (SHEATH) IMPLANT
SHEATH PINNACLE 8F 10CM (SHEATH) IMPLANT
SHEATH PINNACLE 9F 10CM (SHEATH) IMPLANT
SHEATH PROBE COVER 6X72 (BAG) IMPLANT

## 2023-08-04 NOTE — H&P (Signed)
  Electrophysiology Office Note:   Date:  08/04/2023  ID:  [name], DOB 11-08-1985, MRN [id number]  Primary Cardiologist: Thurmon Fair, MD Electrophysiologist: Regan Lemming, MD      History of Present Illness:   [name] is a 38 y.o. male with h/o atrial fibrillation seen today for  for Electrophysiology evaluation of atrial fibrillation at the request of Mihai Croitoru.    Today, denies symptoms of palpitations, chest pain, shortness of breath, orthopnea, PND, lower extremity edema, claudication, dizziness, presyncope, syncope, bleeding, or neurologic sequela. The patient is tolerating medications without difficulties. Plan ablation today.   EP Information / Studies Reviewed:    EKG is not ordered today. EKG from 04/11/23 reviewed which showed sinus rhythm        Risk Assessment/Calculations:    CHA2DS2-VASc Score = 0   This indicates a 0.2% annual risk of stroke. The patient's score is based upon: CHF History: 0 HTN History: 0 Diabetes History: 0 Stroke History: 0 Vascular Disease History: 0 Age Score: 0 Gender Score: 0              Physical Exam:   VS:  BP 112/78   Pulse 60   Temp 97.8 F (36.6 C) (Oral)   Resp 19   Ht 6\' 2"  (1.88 m)   Wt 97.5 kg   SpO2 97%   BMI 27.60 kg/m    Wt Readings from Last 3 Encounters:  08/04/23 97.5 kg  04/11/23 98.7 kg  04/11/23 99.8 kg    GEN: No acute distress.   Neck: No JVD Cardiac: RRR, no murmurs, rubs, or gallops.  Respiratory: decreased BS bases bilaterally. GI: Soft, nontender, non-distended  MS: No edema; No deformity. Neuro:  Nonfocal  Skin: warm and dry,  Psych: Normal affect    ASSESSMENT AND PLAN:    1.  Paroxysmal atrial fibrillation: [name] has presented today for surgery, with the diagnosis of AF.  The various methods of treatment have been discussed with the patient and family. After consideration of risks, benefits and other options for treatment, the patient  has consented to  Procedure(s): Catheter ablation as a surgical intervention .  Risks include but not limited to complete heart block, stroke, esophageal damage, nerve damage, bleeding, vascular damage, tamponade, perforation, MI, and death. The patient's history has been reviewed, patient examined, no change in status, stable for surgery.  I have reviewed the patient's chart and labs.  Questions were answered to the patient's satisfaction.    Angeni Chaudhuri Elberta Fortis, MD 08/04/2023 7:10 AM

## 2023-08-04 NOTE — Anesthesia Postprocedure Evaluation (Signed)
 Anesthesia Post Note  Patient: [name]  Procedure(s) Performed: ATRIAL FIBRILLATION ABLATION     Patient location during evaluation: PACU Anesthesia Type: General Level of consciousness: awake and alert Pain management: pain level controlled Vital Signs Assessment: post-procedure vital signs reviewed and stable Respiratory status: spontaneous breathing, nonlabored ventilation, respiratory function stable and patient connected to nasal cannula oxygen Cardiovascular status: blood pressure returned to baseline and stable Postop Assessment: no apparent nausea or vomiting Anesthetic complications: no  There were no known notable events for this encounter.  Last Vitals:  Vitals:   08/04/23 1200 08/04/23 1224  BP: 118/77 107/67  Pulse: 62   Resp: 19   Temp:    SpO2: 95%     Last Pain:  Vitals:   08/04/23 0957  TempSrc:   PainSc: 0-No pain                 Victoria Henshaw L Hager [place]

## 2023-08-04 NOTE — Discharge Instructions (Signed)

## 2023-08-04 NOTE — Anesthesia Procedure Notes (Signed)
 Procedure Name: Intubation Date/Time: 08/04/2023 7:49 AM  Performed by: Vena Austria, CRNAPre-anesthesia Checklist: Patient identified, Emergency Drugs available, Suction available, Patient being monitored and Timeout performed Patient Re-evaluated:Patient Re-evaluated prior to induction Oxygen Delivery Method: Circle system utilized Preoxygenation: Pre-oxygenation with 100% oxygen Induction Type: IV induction Ventilation: Mask ventilation without difficulty Laryngoscope Size: McGrath and 3 Grade View: Grade I Tube type: Oral Tube size: 7.0 mm Number of attempts: 1 Airway Equipment and Method: Stylet and Video-laryngoscopy Placement Confirmation: ETT inserted through vocal cords under direct vision, positive ETCO2, CO2 detector and breath sounds checked- equal and bilateral Secured at: 22 cm Tube secured with: Tape Dental Injury: Teeth and Oropharynx as per pre-operative assessment

## 2023-08-04 NOTE — Anesthesia Preprocedure Evaluation (Addendum)
 Anesthesia Evaluation  Patient identified by MRN, date of birth, ID band Patient awake    Reviewed: Allergy & Precautions, NPO status , Patient's Chart, lab work & pertinent test results, reviewed documented beta blocker date and time   Airway Mallampati: I  TM Distance: >3 FB Neck ROM: Full    Dental no notable dental hx. (+) Teeth Intact, Dental Advisory Given   Pulmonary former smoker   Pulmonary exam normal breath sounds clear to auscultation       Cardiovascular Normal cardiovascular exam+ dysrhythmias (eliquis) Atrial Fibrillation + Valvular Problems/Murmurs MVP  Rhythm:Regular Rate:Normal     Neuro/Psych negative neurological ROS  negative psych ROS   GI/Hepatic negative GI ROS,,,(+)     substance abuse  alcohol use  Endo/Other  negative endocrine ROS    Renal/GU negative Renal ROS  negative genitourinary   Musculoskeletal negative musculoskeletal ROS (+)    Abdominal   Peds  Hematology negative hematology ROS (+)   Anesthesia Other Findings   Reproductive/Obstetrics                             Anesthesia Physical Anesthesia Plan  ASA: 3  Anesthesia Plan: General   Post-op Pain Management: Minimal or no pain anticipated   Induction: Intravenous  PONV Risk Score and Plan: 2 and Midazolam, Dexamethasone and Ondansetron  Airway Management Planned: Oral ETT  Additional Equipment:   Intra-op Plan:   Post-operative Plan: Extubation in OR  Informed Consent: I have reviewed the patients History and Physical, chart, labs and discussed the procedure including the risks, benefits and alternatives for the proposed anesthesia with the patient or authorized representative who has indicated his/her understanding and acceptance.     Dental advisory given  Plan Discussed with: CRNA  Anesthesia Plan Comments:        Anesthesia Quick Evaluation

## 2023-08-04 NOTE — Transfer of Care (Signed)
 Immediate Anesthesia Transfer of Care Note  Patient: [name]  Procedure(s) Performed: ATRIAL FIBRILLATION ABLATION  Patient Location: PACU and Cath Lab  Anesthesia Type:General  Level of Consciousness: awake and alert   Airway & Oxygen Therapy: Patient connected to nasal cannula oxygen  Post-op Assessment: Report given to RN  Post vital signs: stable  Last Vitals:  Vitals Value Taken Time  BP 100/71 08/04/23 0915  Temp 36.6 C 08/04/23 0905  Pulse 64 08/04/23 0920  Resp 11 08/04/23 0920  SpO2 95 % 08/04/23 0920  Vitals shown include unfiled device data.  Last Pain:  Vitals:   08/04/23 0911  TempSrc:   PainSc: 0-No pain         Complications: There were no known notable events for this encounter.

## 2023-08-05 ENCOUNTER — Telehealth (HOSPITAL_COMMUNITY): Payer: Self-pay

## 2023-08-05 ENCOUNTER — Telehealth: Payer: Self-pay | Admitting: Cardiology

## 2023-08-05 ENCOUNTER — Encounter (HOSPITAL_COMMUNITY): Payer: Self-pay | Admitting: Cardiology

## 2023-08-05 ENCOUNTER — Encounter: Payer: Self-pay | Admitting: Emergency Medicine

## 2023-08-05 NOTE — Telephone Encounter (Signed)
 New Message:       Patient said he had an Ablation yesterday and now he is running a fever.

## 2023-08-05 NOTE — Telephone Encounter (Signed)
 Spoke with patient to complete post procedure follow up call.  Patient reports no complications with groin sites.   Instructions reviewed with patient:  Remove large bandage at puncture site after 24 hours. It is normal to have bruising, tenderness and a pea or marble sized lump/knot at the groin site which can take up to three months to resolve.  Get help right away if you notice sudden swelling at the puncture site.  Check your puncture site every day for signs of infection: fever, redness, swelling, pus drainage, warmth, foul odor or excessive pain. If this occurs, please call the office at [phone], to speak with the nurse. Get help right away if your puncture site is bleeding and the bleeding does not stop after applying firm pressure to the area.  You may continue to have skipped beats/ atrial fibrillation during the first several months after your procedure.  It is very important not to miss any doses of your blood thinner Eliquis. Patient restarted taking this medication on yesterday, 08/04/23   You will follow up with the Afib clinic on 09/01/23 and follow up with the APP on 11/01/23.   Patient verbalized understanding to all instructions provided.

## 2023-08-05 NOTE — Telephone Encounter (Signed)
 Spoke pt and he reports that he is asymptomatic other than feeling warm and temp of 100. Spoke to DOD (Dr. Rosemary Holms) and advised pt to monitor for symptoms of infection such as redness, discharge, and increasing fever. If worsening of fever and symptoms ocur then for pt to go to ER for evaluation. Also advised per provider for pt to try taking Tylenol as well. Pt agrees with plan of care.

## 2023-08-09 MED FILL — Fentanyl Citrate Preservative Free (PF) Inj 100 MCG/2ML: INTRAMUSCULAR | Qty: 2 | Status: AC

## 2023-08-10 ENCOUNTER — Encounter: Payer: Self-pay | Admitting: Cardiology

## 2023-08-11 ENCOUNTER — Encounter: Payer: Self-pay | Admitting: *Deleted

## 2023-08-13 ENCOUNTER — Ambulatory Visit (HOSPITAL_COMMUNITY): Admission: EM | Admit: 2023-08-13 | Discharge: 2023-08-13 | Disposition: A | Discharge status: Home / Self Care

## 2023-08-13 ENCOUNTER — Encounter (HOSPITAL_COMMUNITY): Payer: Self-pay

## 2023-08-13 ENCOUNTER — Emergency Department (HOSPITAL_COMMUNITY)

## 2023-08-13 ENCOUNTER — Observation Stay (HOSPITAL_COMMUNITY)
Admission: EM | Admit: 2023-08-13 | Discharge: 2023-08-14 | Disposition: A | Discharge status: Home / Self Care | Attending: Emergency Medicine | Admitting: Emergency Medicine

## 2023-08-13 DIAGNOSIS — I319 Disease of pericardium, unspecified: Secondary | ICD-10-CM | POA: Diagnosis present

## 2023-08-13 DIAGNOSIS — I2693 Single subsegmental pulmonary embolism without acute cor pulmonale: Secondary | ICD-10-CM | POA: Diagnosis not present

## 2023-08-13 DIAGNOSIS — Z79899 Other long term (current) drug therapy: Secondary | ICD-10-CM | POA: Insufficient documentation

## 2023-08-13 DIAGNOSIS — Z7901 Long term (current) use of anticoagulants: Secondary | ICD-10-CM | POA: Diagnosis not present

## 2023-08-13 DIAGNOSIS — I309 Acute pericarditis, unspecified: Secondary | ICD-10-CM | POA: Diagnosis not present

## 2023-08-13 DIAGNOSIS — I480 Paroxysmal atrial fibrillation: Secondary | ICD-10-CM | POA: Diagnosis not present

## 2023-08-13 DIAGNOSIS — Z1152 Encounter for screening for COVID-19: Secondary | ICD-10-CM | POA: Diagnosis not present

## 2023-08-13 DIAGNOSIS — I2699 Other pulmonary embolism without acute cor pulmonale: Secondary | ICD-10-CM | POA: Diagnosis present

## 2023-08-13 DIAGNOSIS — R918 Other nonspecific abnormal finding of lung field: Secondary | ICD-10-CM | POA: Insufficient documentation

## 2023-08-13 DIAGNOSIS — R079 Chest pain, unspecified: Secondary | ICD-10-CM | POA: Diagnosis not present

## 2023-08-13 LAB — BASIC METABOLIC PANEL
Anion gap: 11 (ref 5–15)
BUN: 24 mg/dL — ABNORMAL HIGH (ref 6–20)
CO2: 25 mmol/L (ref 22–32)
Calcium: 9.4 mg/dL (ref 8.9–10.3)
Chloride: 101 mmol/L (ref 98–111)
Creatinine, Ser: 1.14 mg/dL (ref 0.61–1.24)
GFR, Estimated: >60 mL/min (ref >60)
Glucose, Bld: 105 mg/dL — ABNORMAL HIGH (ref 70–99)
Potassium: 3.5 mmol/L (ref 3.5–5.1)
Sodium: 137 mmol/L (ref 135–145)

## 2023-08-13 LAB — CBC
HCT: 43 % (ref 39.0–52.0)
Hemoglobin: 14.8 g/dL (ref 13.0–17.0)
MCH: 30.8 pg (ref 26.0–34.0)
MCHC: 34.4 g/dL (ref 30.0–36.0)
MCV: 89.6 fL (ref 80.0–100.0)
Platelets: 327 10*3/uL (ref 150–400)
RBC: 4.8 MIL/uL (ref 4.22–5.81)
RDW: 12.8 % (ref 11.5–15.5)
WBC: 15.8 10*3/uL — ABNORMAL HIGH (ref 4.0–10.5)
nRBC: 0 % (ref 0.0–0.2)

## 2023-08-13 LAB — RESP PANEL BY RT-PCR (RSV, FLU A&B, COVID)  RVPGX2
Influenza A by PCR: NEGATIVE
Influenza B by PCR: NEGATIVE
Resp Syncytial Virus by PCR: NEGATIVE
SARS Coronavirus 2 by RT PCR: NEGATIVE

## 2023-08-13 LAB — D-DIMER, QUANTITATIVE: D-Dimer, Quant: 0.43 ug{FEU}/mL (ref 0.00–0.50)

## 2023-08-13 LAB — SEDIMENTATION RATE: Sed Rate: 15 mm/h (ref 0–16)

## 2023-08-13 LAB — C-REACTIVE PROTEIN: CRP: 0.7 mg/dL (ref <1.0)

## 2023-08-13 LAB — TROPONIN I (HIGH SENSITIVITY)
Troponin I (High Sensitivity): 92 ng/L — ABNORMAL HIGH (ref <18)
Troponin I (High Sensitivity): 81 ng/L — ABNORMAL HIGH (ref <18)

## 2023-08-13 LAB — I-STAT CG4 LACTIC ACID, ED
Lactic Acid, Venous: 0.9 mmol/L (ref 0.5–1.9)
Lactic Acid, Venous: 0.7 mmol/L (ref 0.5–1.9)

## 2023-08-13 MED ORDER — SODIUM CHLORIDE 0.9 % IV BOLUS
1000.0000 mL | Freq: Once | INTRAVENOUS | Status: AC
  Administered 2023-08-13: 1000 mL via INTRAVENOUS

## 2023-08-13 MED ORDER — KETOROLAC TROMETHAMINE 30 MG/ML IJ SOLN
30.0000 mg | Freq: Once | INTRAMUSCULAR | Status: AC
  Administered 2023-08-13: 30 mg via INTRAVENOUS
  Filled 2023-08-13: qty 1

## 2023-08-13 MED ORDER — IOHEXOL 350 MG/ML SOLN
75.0000 mL | Freq: Once | INTRAVENOUS | Status: AC | PRN
  Administered 2023-08-13: 75 mL via INTRAVENOUS

## 2023-08-13 MED ORDER — COLCHICINE 0.6 MG PO TABS
0.6000 mg | ORAL_TABLET | Freq: Two times a day (BID) | ORAL | Status: DC
  Administered 2023-08-13 – 2023-08-14 (×2): 0.6 mg via ORAL
  Filled 2023-08-13 (×2): qty 1

## 2023-08-13 MED ORDER — ACETAMINOPHEN 500 MG PO TABS
1000.0000 mg | ORAL_TABLET | Freq: Once | ORAL | Status: AC
  Administered 2023-08-13: 1000 mg via ORAL
  Filled 2023-08-13: qty 2

## 2023-08-13 NOTE — ED Triage Notes (Signed)
 Patient reports tha the has been having chest pain since this AM ,but worsening. Patient had an ablation on 08/05/23. Patient has a T. 100.0 and is currently on Eliquis.

## 2023-08-13 NOTE — ED Provider Notes (Signed)
 [place] EMERGENCY DEPARTMENT AT [facility] Provider Note   CSN: [id number] Arrival date & time: 08/13/23  1746     History  Chief Complaint  Patient presents with   Chest Pain    [name] is a 38 y.o. male.  Pt is a 38 yo male with pmhx significant for afib (s/p ablation on 3/13).  Pt said he had some fevers after procedure, took tylenol and they went away.  He felt better and then developed fevers, chills and chest pain.  Pain is worse with inhalation.  No redness/swelling around catheter site.       Home Medications Prior to Admission medications   Medication Sig Start Date End Date Taking? Authorizing Provider  apixaban (ELIQUIS) 5 MG TABS tablet Take 1 tablet (5 mg total) by mouth 2 (two) times daily. 07/08/23   Camnitz, Andree Coss, MD  FIBER GUMMIES PO Take 1 tablet by mouth daily as needed (Constipation).    [provider]  flecainide (TAMBOCOR) 100 MG tablet Take 100 mg by mouth as needed (Afib). 02/28/23   [provider]  Melatonin 5 MG CHEW Chew 5-10 mg by mouth at bedtime as needed (sleep).    [provider]  metoprolol succinate (TOPROL-XL) 25 MG 24 hr tablet Take 25 mg by mouth as needed (Afib). 02/28/23   [provider]  multivitamin (ONE-A-DAY MEN'S) TABS tablet Take 1 tablet by mouth daily. 07/06/21   [provider]  sildenafil (VIAGRA) 100 MG tablet TAKE 1/2 TO 1 TABLET BY MOUTH EVERY DAY AS NEEDED 12/04/20   Shade Flood, MD      Allergies    Patient has no known allergies.    Review of Systems   Review of Systems  Cardiovascular:  Positive for chest pain.  All other systems reviewed and are negative.   Physical Exam Updated Vital Signs BP 121/86 (BP Location: Right Arm)   Pulse 88   Temp 99.4 F (37.4 C) (Oral)   Resp 18   Ht 6\' 2"  (1.88 m)   Wt 97.5 kg   SpO2 96%   BMI 27.60 kg/m  Physical Exam Vitals and nursing note reviewed.  Constitutional:      Appearance: He  is well-developed.  HENT:     Head: Normocephalic and atraumatic.  Eyes:     Extraocular Movements: Extraocular movements intact.     Pupils: Pupils are equal, round, and reactive to light.  Cardiovascular:     Rate and Rhythm: Regular rhythm. Tachycardia present.     Heart sounds: Normal heart sounds.  Pulmonary:     Effort: Pulmonary effort is normal.     Breath sounds: Normal breath sounds.  Abdominal:     General: Bowel sounds are normal.     Palpations: Abdomen is soft.  Musculoskeletal:        General: Normal range of motion.     Cervical back: Normal range of motion and neck supple.  Skin:    General: Skin is warm.     Capillary Refill: Capillary refill takes less than 2 seconds.     Comments: Bruising in left groin; no redness/swelling  Neurological:     General: No focal deficit present.     Mental Status: He is alert and oriented to person, place, and time.     ED Results / Procedures / Treatments   Labs (all labs ordered are listed, but only abnormal results are displayed) Labs Reviewed  BASIC METABOLIC PANEL -  Abnormal; Notable for the following components:      Result Value   Glucose, Bld 105 (*)    BUN 24 (*)    All other components within normal limits  CBC - Abnormal; Notable for the following components:   WBC 15.8 (*)    All other components within normal limits  TROPONIN I (HIGH SENSITIVITY) - Abnormal; Notable for the following components:   Troponin I (High Sensitivity) 92 (*)    All other components within normal limits  TROPONIN I (HIGH SENSITIVITY) - Abnormal; Notable for the following components:   Troponin I (High Sensitivity) 81 (*)    All other components within normal limits  RESP PANEL BY RT-PCR (RSV, FLU A&B, COVID)  RVPGX2  CULTURE, BLOOD (ROUTINE X 2)  CULTURE, BLOOD (ROUTINE X 2)  SEDIMENTATION RATE  C-REACTIVE PROTEIN  D-DIMER, QUANTITATIVE  URINALYSIS, ROUTINE W REFLEX MICROSCOPIC  I-STAT CG4 LACTIC ACID, ED  I-STAT CG4 LACTIC  ACID, ED    EKG EKG Interpretation Date/Time:  Saturday August 13 2023 17:53:16 EDT Ventricular Rate:  115 PR Interval:  156 QRS Duration:  86 QT Interval:  312 QTC Calculation: 431 R Axis:   9  Text Interpretation: Sinus tachycardia T wave abnormality, consider lateral ischemia Abnormal ECG When compared with ECG of 13-Aug-2023 17:40, PREVIOUS ECG IS PRESENT Since last tracing rate faster Confirmed by Jacalyn Lefevre [phone]) on 08/13/2023 6:06:21 PM  Radiology CT Angio Chest PE W and/or Wo Contrast Result Date: 08/13/2023 CLINICAL DATA:  Pulmonary embolism (PE) suspected, high prob EXAM: CT ANGIOGRAPHY CHEST WITH CONTRAST TECHNIQUE: Multidetector CT imaging of the chest was performed using the standard protocol during bolus administration of intravenous contrast. Multiplanar CT image reconstructions and MIPs were obtained to evaluate the vascular anatomy. RADIATION DOSE REDUCTION: This exam was performed according to the departmental dose-optimization program which includes automated exposure control, adjustment of the mA and/or kV according to patient size and/or use of iterative reconstruction technique. CONTRAST:  75mL OMNIPAQUE IOHEXOL 350 MG/ML SOLN COMPARISON:  CT heart 07/14/2023 FINDINGS: Cardiovascular: Satisfactory opacification of the pulmonary arteries to the segmental level. Right lower lobe segmental subsegmental pulmonary embolus. Normal heart size. No significant pericardial effusion. The thoracic aorta is normal in caliber. No atherosclerotic plaque of the thoracic aorta. No coronary artery calcifications. Mediastinum/Nodes: No enlarged mediastinal, hilar, or axillary lymph nodes. Thyroid gland, trachea, and esophagus demonstrate no significant findings. Lungs/Pleura: Bibasilar, left greater than right, base atelectasis. No focal consolidation. No pulmonary nodule. No pulmonary mass. No pleural effusion. No pneumothorax. Upper Abdomen: No acute abnormality. Musculoskeletal: No chest  wall abnormality.  Bilateral gynecomastia. No suspicious lytic or blastic osseous lesions. No acute displaced fracture. Review of the MIP images confirms the above findings. IMPRESSION: Right lower lobe segmental subsegmental pulmonary embolus. No associated pulmonary infarction or right heart strain. These results were called by telephone at the time of interpretation on 08/13/2023 at 8:30 pm to provider [facility] , who verbally acknowledged these results. Electronically Signed   By: Tish Frederickson M.D.   On: 08/13/2023 20:30   DG Chest 2 View Result Date: 08/13/2023 CLINICAL DATA:  chest pain EXAM: CHEST - 2 VIEW COMPARISON:  Chest x-ray 11/15/2021, CT cardiac 07/14/2023 trauma chest x-ray 01/22/2005 FINDINGS: The heart and mediastinal contours are within normal limits. No focal consolidation. No pulmonary edema. No pleural effusion. No pneumothorax. No acute osseous abnormality. IMPRESSION: No active cardiopulmonary disease. Electronically Signed   By: Tish Frederickson M.D.   On: 08/13/2023 19:20  Procedures Procedures    Medications Ordered in ED Medications  colchicine tablet 0.6 mg (0.6 mg Oral Given 08/13/23 2240)  sodium chloride 0.9 % bolus 1,000 mL (0 mLs Intravenous Stopped 08/13/23 1956)  ketorolac (TORADOL) 30 MG/ML injection 30 mg (30 mg Intravenous Given 08/13/23 1849)  acetaminophen (TYLENOL) tablet 1,000 mg (1,000 mg Oral Given 08/13/23 1844)  iohexol (OMNIPAQUE) 350 MG/ML injection 75 mL (75 mLs Intravenous Contrast Given 08/13/23 2015)    ED Course/ Medical Decision Making/ A&P                                 Medical Decision Making Amount and/or Complexity of Data Reviewed Labs: ordered. Radiology: ordered.  Risk OTC drugs. Prescription drug management. Decision regarding hospitalization.   This patient presents to the ED for concern of cp/fever, this involves an extensive number of treatment options, and is a complaint that carries with it a high risk of  complications and morbidity.  The differential diagnosis includes cap, covid/flu/rsv, sepsis   Co morbidities that complicate the patient evaluation  afib (s/p ablation on 3/13)   Additional history obtained:  Additional history obtained from epic chart review External records from outside source obtained and reviewed including wife   Lab Tests:  I Ordered, and personally interpreted labs.  The pertinent results include:  cbc with wbc elevated at 15.8, bmp nl, trop 92   Imaging Studies ordered:  I ordered imaging studies including cxr and ct chest I independently visualized and interpreted imaging which showed  CXR: No active cardiopulmonary disease.  CT chest: Right lower lobe segmental subsegmental pulmonary embolus. No  associated pulmonary infarction or right heart strain.   I agree with the radiologist interpretation   Cardiac Monitoring:  The patient was maintained on a cardiac monitor.  I personally viewed and interpreted the cardiac monitored which showed an underlying rhythm of: st   Medicines ordered and prescription drug management:  I ordered medication including ivfs/toradol/tylenol  for sx  Reevaluation of the patient after these medicines showed that the patient improved I have reviewed the patients home medicines and have made adjustments as needed   Test Considered:  ct   Critical Interventions:  Ivfs/meds   Consultations Obtained:  I requested consultation with the cardiologist,  and discussed lab and imaging findings as well as pertinent plan - they recommend: medicine admission    Problem List / ED Course:  Fevers/chills:  possibly due to uri vs pericarditis CP: pericarditis vs PE   Reevaluation:  After the interventions noted above, I reevaluated the patient and found that they have :improved   Social Determinants of Health:  Lives at home   Dispostion:  After consideration of the diagnostic results and the patients  response to treatment, I feel that the patent would benefit from admission.          Final Clinical Impression(s) / ED Diagnoses Final diagnoses:  Single subsegmental pulmonary embolism without acute cor pulmonale (HCC)  Acute pericarditis, unspecified type    Rx / DC Orders ED Discharge Orders     None         Jacalyn Lefevre, MD 08/13/23 2350

## 2023-08-13 NOTE — ED Notes (Signed)
 ED Provider at bedside.

## 2023-08-13 NOTE — ED Notes (Signed)
 CCMD notified for continuous cardiac.

## 2023-08-13 NOTE — Consult Note (Signed)
 Cardiology Consult:   Patient ID: [name] MRN: [id number]; DOB: July 13, 1985   Admission date: 08/13/2023  Primary Care Provider: System, Provider Not In Primary Cardiologist: Thurmon Fair, MD  Primary Electrophysiologist:  Will Jorja Loa, MD   Chief Complaint: Chest pain and low-grade fever  Patient Profile:   [name] is a 38 y.o. male with atrial fibrillation status post ablation.  History of Present Illness:   [name] had a A-fib ablation on August 04, 2023. Given his history since the ablation he has noticed low-grade fevers of 100 F.  He was recommended to take Tylenol to address the fevers and to attend the hospital if his symptoms worsened or if he developed chest pain. He presents to the emergency department with new onset chest pain today.  He describes the chest pain as sharp and worse with inspiration. He also noticed when he obtained a CT that it was exacerbated by laying down flat. Denies orthopnea lower extremity edema.   While in the emergency department he was diagnosed with segmental and subsegmental pulmonary embolism.  Past Medical History:  Diagnosis Date   A-fib (HCC)    Heart murmur    Mitral valve prolapse    Paroxysmal atrial fibrillation (HCC)    Testosterone deficiency     Past Surgical History:  Procedure Laterality Date   ATRIAL FIBRILLATION ABLATION N/A 08/04/2023   Procedure: ATRIAL FIBRILLATION ABLATION;  Surgeon: Regan Lemming, MD;  Location: [facility] INVASIVE CV LAB;  Service: Cardiovascular;  Laterality: N/A;   HIP SURGERY     NM MYOCAR PERF WALL MOTION  12/11/2004   [place], MD: no ischemia   SURGERY SCROTAL / TESTICULAR  06/1994   torsion   US ECHOCARDIOGRAPHY  10/06/2010   LA mildly dilated,trace MR   WRIST SURGERY  05/2004   tendon repair     Medications Prior to Admission: Prior to Admission medications   Medication Sig Start Date End Date Taking? Authorizing Provider  apixaban (ELIQUIS) 5 MG TABS  tablet Take 1 tablet (5 mg total) by mouth 2 (two) times daily. 07/08/23   Camnitz, Andree Coss, MD  FIBER GUMMIES PO Take 1 tablet by mouth daily as needed (Constipation).    [provider]  flecainide (TAMBOCOR) 100 MG tablet Take 100 mg by mouth as needed (Afib). 02/28/23   [provider]  Melatonin 5 MG CHEW Chew 5-10 mg by mouth at bedtime as needed (sleep).    [provider]  metoprolol succinate (TOPROL-XL) 25 MG 24 hr tablet Take 25 mg by mouth as needed (Afib). 02/28/23   [provider]  multivitamin (ONE-A-DAY MEN'S) TABS tablet Take 1 tablet by mouth daily. 07/06/21   [provider]  sildenafil (VIAGRA) 100 MG tablet TAKE 1/2 TO 1 TABLET BY MOUTH EVERY DAY AS NEEDED 12/04/20   Shade Flood, MD     Allergies:   No Known Allergies  Social History:   Social History   Socioeconomic History   Marital status: Married    Spouse name: Not on file   Number of children: Not on file   Years of education: Not on file   Highest education level: Not on file  Occupational History   Not on file  Tobacco Use   Smoking status: Never   Smokeless tobacco: Never  Vaping Use   Vaping status: Never Used  Substance and Sexual Activity   Alcohol use: Yes    Comment: social   Drug use: No  Sexual activity: Yes    Birth control/protection: None  Other Topics Concern   Not on file  Social History Narrative   Not on file   Social Drivers of Health   Financial Resource Strain: Low Risk  (03/01/2023)   Received from [facility]   Overall Financial Resource Strain (CARDIA)    Difficulty of Paying Living Expenses: Not hard at all  Food Insecurity: No Food Insecurity (03/01/2023)   Received from [facility]   Hunger Vital Sign    Worried About Running Out of Food in the Last Year: Never true    Ran Out of Food in the Last Year: Never true  Transportation Needs: No Transportation Needs (03/01/2023)   Received from [facility]  - Transportation    Lack of Transportation (Medical): No    Lack of Transportation (Non-Medical): No  Physical Activity: Sufficiently Active (03/01/2023)   Received from [facility]   Exercise Vital Sign    Days of Exercise per Week: 6 days    Minutes of Exercise per Session: 50 min  Stress: No Stress Concern Present (03/01/2023)   Received from [facility] - Occupational Stress Questionnaire    Feeling of Stress : Only a little  Social Connections: Socially Integrated (03/01/2023)   Received from [facility]   Social Network    How would you rate your social network (family, work, friends)?: Good participation with social networks  Intimate Partner Violence: Not At Risk (03/01/2023)   Received from [facility]   HITS    Over the last 12 months how often did your partner physically hurt you?: Never    Over the last 12 months how often did your partner insult you or talk down to you?: Never    Over the last 12 months how often did your partner threaten you with physical harm?: Never    Over the last 12 months how often did your partner scream or curse at you?: Never     Physical Exam/Data:   Vitals:   08/13/23 1749 08/13/23 1800 08/13/23 1845 08/13/23 1851  BP: 134/86  130/80   Pulse: (!) 110  (!) 107   Resp: 18  18   Temp: 99.6 F (37.6 C)     SpO2: 98%  96% 96%  Weight:  97.5 kg    Height:  6\' 2"  (1.88 m)      Intake/Output Summary (Last 24 hours) at 08/13/2023 2136 Last data filed at 08/13/2023 1956 Gross per 24 hour  Intake 1000 ml  Output --  Net 1000 ml   Filed Weights   08/13/23 1800  Weight: 97.5 kg   Body mass index is 27.6 kg/m.  General:  Well nourished, well developed, in no acute distress HEENT: normal Neck: no JVD Cardiac:  RRR Lungs:  clear to auscultation bilaterally Abd: soft Ext: no edemal Skin: warm and dry  Psych:  Normal affect   EKG: EKG obtained March 22 at 1806 demonstrates sinus  tachycardia without ST segment deviation.T wave inversion V5 V6 and aVL is present.  Laboratory Data:  Chemistry Recent Labs  Lab 08/13/23 1807  NA 137  K 3.5  CL 101  CO2 25  GLUCOSE 105*  BUN 24*  CREATININE 1.14  CALCIUM 9.4  GFRNONAA >60  ANIONGAP 11    No results for input(s): "PROT", "ALBUMIN", "AST", "ALT", "ALKPHOS", "BILITOT" in the last 168 hours. Hematology Recent Labs  Lab 08/13/23 1807  WBC  15.8*  RBC 4.80  HGB 14.8  HCT 43.0  MCV 89.6  MCH 30.8  MCHC 34.4  RDW 12.8  PLT 327   Radiology/Studies:  CT Angio Chest PE W and/or Wo Contrast Result Date: 08/13/2023 CLINICAL DATA:  Pulmonary embolism (PE) suspected, high prob EXAM: CT ANGIOGRAPHY CHEST WITH CONTRAST TECHNIQUE: Multidetector CT imaging of the chest was performed using the standard protocol during bolus administration of intravenous contrast. Multiplanar CT image reconstructions and MIPs were obtained to evaluate the vascular anatomy. RADIATION DOSE REDUCTION: This exam was performed according to the departmental dose-optimization program which includes automated exposure control, adjustment of the mA and/or kV according to patient size and/or use of iterative reconstruction technique. CONTRAST:  75mL OMNIPAQUE IOHEXOL 350 MG/ML SOLN COMPARISON:  CT heart 07/14/2023 FINDINGS: Cardiovascular: Satisfactory opacification of the pulmonary arteries to the segmental level. Right lower lobe segmental subsegmental pulmonary embolus. Normal heart size. No significant pericardial effusion. The thoracic aorta is normal in caliber. No atherosclerotic plaque of the thoracic aorta. No coronary artery calcifications. Mediastinum/Nodes: No enlarged mediastinal, hilar, or axillary lymph nodes. Thyroid gland, trachea, and esophagus demonstrate no significant findings. Lungs/Pleura: Bibasilar, left greater than right, base atelectasis. No focal consolidation. No pulmonary nodule. No pulmonary mass. No pleural effusion. No  pneumothorax. Upper Abdomen: No acute abnormality. Musculoskeletal: No chest wall abnormality.  Bilateral gynecomastia. No suspicious lytic or blastic osseous lesions. No acute displaced fracture. Review of the MIP images confirms the above findings. IMPRESSION: Right lower lobe segmental subsegmental pulmonary embolus. No associated pulmonary infarction or right heart strain. These results were called by telephone at the time of interpretation on 08/13/2023 at 8:30 pm to provider [facility] , who verbally acknowledged these results. Electronically Signed   By: Tish Frederickson M.D.   On: 08/13/2023 20:30   DG Chest 2 View Result Date: 08/13/2023 CLINICAL DATA:  chest pain EXAM: CHEST - 2 VIEW COMPARISON:  Chest x-ray 11/15/2021, CT cardiac 07/14/2023 trauma chest x-ray 01/22/2005 FINDINGS: The heart and mediastinal contours are within normal limits. No focal consolidation. No pulmonary edema. No pleural effusion. No pneumothorax. No acute osseous abnormality. IMPRESSION: No active cardiopulmonary disease. Electronically Signed   By: Tish Frederickson M.D.   On: 08/13/2023 19:20    Assessment and Plan:   Mr. Mcclenton is a 38 year old male with a history of atrial fibrillation status post ablation on March 13 who presented for chest pain and low-grade fevers.  He was incidentally found to have a pulmonary embolus in his right subsegmental pulmonary arteries while on apixaban.  Cardiology was consulted for chest pain.  I performed a POCUS at the bedside and he had normal biventricular function and no pericardial effusion. His description of the chest pain appears to be pleuritic however it does improve with leaning forward and worsened with leaning backwards. In the setting of a recent ablation, his symptoms appear consistent with pericarditis. Would recommend treatment with colchicine.   Given his chest pain, he underwent a CT PE which did demonstrate right lower lobe subsegmental pulmonary embolism. Upon  reviewing the scan, there did not appear to be sufficient contrast dye within the vessel for me to fully differentiate thrombus from artifact. Given the trepidation, a D-dimer was obtained. The D-dimer was normal. A D-dimer is 95% sensitive for a pulmonary embolism. Given the negative D-dimer and the poor contrast load, I wonder if what was called a pulmonary embolism is an artifact.  Mr. Ke would benefit from additional testing to identify diagnosis. If in  fact it is a pulmonary embolism, then a discussion should be had with hematology regarding a prothrombotic workup, obtaining a X-a level, and changing from his DOAC to warfarin. However, if it is indeed an artifact and not a pulmonary embolism, then no change to his DOAC is needed.   Signed, Urban Gibson, MD  08/13/2023 9:36 PM

## 2023-08-13 NOTE — ED Triage Notes (Signed)
 Pt c/o central chest pain starting this morning.  Pain score 8/10 w/ inhalation.  Pt reports having an ablation on 3/13.

## 2023-08-13 NOTE — ED Notes (Signed)
 Patient is being discharged from the Urgent Care and sent to the Emergency Department via POV . Per [employer], PA, patient is in need of higher level of care due to chest pain and fever. Patient is aware and verbalizes understanding of plan of care.  Vitals:   08/13/23 1740  BP: (!) 139/92  Pulse: (!) 112  Resp: 18  Temp: 100 F (37.8 C)  SpO2: 99%

## 2023-08-13 NOTE — ED Provider Notes (Signed)
 Here with chest pain that began this morning. Some pressure with deep breath.  History of afib. Had cardiac ablation 9 days ago on 3/13. Reporting low grade fever the first 3 days after procedure, called cardiology who recommended tylenol. No fever since then  EKG in clinic sinus tachycardia, ventricular rate 112 Pulses are equal, strong and regular.  Temp is 100 on arrival  With chest pain, recent ablation, patient requires evaluation in the ED. Higher level of care and imaging/labs not available in the urgent care setting. Sent to ED via POV with wife   [name], [place] 08/13/23 1746

## 2023-08-14 ENCOUNTER — Observation Stay (HOSPITAL_BASED_OUTPATIENT_CLINIC_OR_DEPARTMENT_OTHER)

## 2023-08-14 ENCOUNTER — Other Ambulatory Visit: Payer: Self-pay

## 2023-08-14 DIAGNOSIS — I2693 Single subsegmental pulmonary embolism without acute cor pulmonale: Secondary | ICD-10-CM | POA: Diagnosis not present

## 2023-08-14 DIAGNOSIS — R079 Chest pain, unspecified: Secondary | ICD-10-CM | POA: Diagnosis not present

## 2023-08-14 DIAGNOSIS — I300 Acute nonspecific idiopathic pericarditis: Secondary | ICD-10-CM | POA: Diagnosis not present

## 2023-08-14 DIAGNOSIS — I480 Paroxysmal atrial fibrillation: Secondary | ICD-10-CM

## 2023-08-14 DIAGNOSIS — I309 Acute pericarditis, unspecified: Principal | ICD-10-CM

## 2023-08-14 DIAGNOSIS — Z86711 Personal history of pulmonary embolism: Secondary | ICD-10-CM | POA: Diagnosis not present

## 2023-08-14 DIAGNOSIS — I4891 Unspecified atrial fibrillation: Secondary | ICD-10-CM

## 2023-08-14 DIAGNOSIS — I2699 Other pulmonary embolism without acute cor pulmonale: Secondary | ICD-10-CM | POA: Diagnosis present

## 2023-08-14 DIAGNOSIS — I319 Disease of pericardium, unspecified: Secondary | ICD-10-CM | POA: Diagnosis present

## 2023-08-14 LAB — COMPREHENSIVE METABOLIC PANEL
ALT: 94 U/L — ABNORMAL HIGH (ref 0–44)
AST: 29 U/L (ref 15–41)
Albumin: 3.5 g/dL (ref 3.5–5.0)
Alkaline Phosphatase: 58 U/L (ref 38–126)
Anion gap: 9 (ref 5–15)
BUN: 20 mg/dL (ref 6–20)
CO2: 25 mmol/L (ref 22–32)
Calcium: 9.2 mg/dL (ref 8.9–10.3)
Chloride: 104 mmol/L (ref 98–111)
Creatinine, Ser: 1.05 mg/dL (ref 0.61–1.24)
GFR, Estimated: >60 mL/min (ref >60)
Glucose, Bld: 98 mg/dL (ref 70–99)
Potassium: 4.3 mmol/L (ref 3.5–5.1)
Sodium: 138 mmol/L (ref 135–145)
Total Bilirubin: 1.2 mg/dL (ref 0.0–1.2)
Total Protein: 6.5 g/dL (ref 6.5–8.1)

## 2023-08-14 LAB — URINALYSIS, ROUTINE W REFLEX MICROSCOPIC
Bilirubin Urine: NEGATIVE
Glucose, UA: NEGATIVE mg/dL
Hgb urine dipstick: NEGATIVE
Ketones, ur: NEGATIVE mg/dL
Leukocytes,Ua: NEGATIVE
Nitrite: NEGATIVE
Protein, ur: NEGATIVE mg/dL
Specific Gravity, Urine: 1.02 (ref 1.005–1.030)
pH: 6 (ref 5.0–8.0)

## 2023-08-14 LAB — HIV ANTIBODY (ROUTINE TESTING W REFLEX): HIV Screen 4th Generation wRfx: NONREACTIVE

## 2023-08-14 LAB — ECHOCARDIOGRAM COMPLETE
Area-P 1/2: 3.91 cm2
Height: 74 in
S' Lateral: 3.4 cm
Weight: 3464 [oz_av]

## 2023-08-14 MED ORDER — ACETAMINOPHEN 650 MG RE SUPP: 650.0000 mg | Freq: Four times a day (QID) | RECTAL | Status: DC | PRN

## 2023-08-14 MED ORDER — APIXABAN 5 MG PO TABS
5.0000 mg | ORAL_TABLET | Freq: Two times a day (BID) | ORAL | Status: DC
  Administered 2023-08-14: 5 mg via ORAL
  Filled 2023-08-14: qty 1

## 2023-08-14 MED ORDER — ONDANSETRON HCL 4 MG PO TABS: 4.0000 mg | ORAL_TABLET | Freq: Four times a day (QID) | ORAL | Status: DC | PRN

## 2023-08-14 MED ORDER — ACETAMINOPHEN 325 MG PO TABS: 650.0000 mg | ORAL_TABLET | Freq: Four times a day (QID) | ORAL | Status: DC | PRN

## 2023-08-14 MED ORDER — COLCHICINE 0.6 MG PO TABS: 0.6000 mg | ORAL_TABLET | Freq: Two times a day (BID) | ORAL | 0 refills | Status: DC

## 2023-08-14 MED ORDER — LACTATED RINGERS IV BOLUS
1000.0000 mL | Freq: Once | INTRAVENOUS | Status: AC
  Administered 2023-08-14: 1000 mL via INTRAVENOUS

## 2023-08-14 MED ORDER — MELATONIN 5 MG PO TABS: 5.0000 mg | ORAL_TABLET | Freq: Every evening | ORAL | Status: DC | PRN

## 2023-08-14 MED ORDER — ONDANSETRON HCL 4 MG/2ML IJ SOLN: 4.0000 mg | Freq: Four times a day (QID) | INTRAMUSCULAR | Status: DC | PRN

## 2023-08-14 MED ORDER — ACETAMINOPHEN 325 MG PO TABS
650.0000 mg | ORAL_TABLET | Freq: Four times a day (QID) | ORAL | Status: DC | PRN
  Administered 2023-08-14: 650 mg via ORAL
  Filled 2023-08-14: qty 2

## 2023-08-14 NOTE — Discharge Summary (Signed)
 Physician Discharge Summary  [name] EAV:[id number] DOB: 04-02-1986 DOA: 08/13/2023  PCP: System, Provider Not In  Admit date: 08/13/2023 Discharge date: 08/14/2023  Admitted From: Home Discharge disposition: Home  Recommendations at discharge:  Colchicine 0.6 mg twice daily for 3 months.  As needed Tylenol for pain control. Continue Eliquis as before Follow-up with cardiology as an outpatient   Brief narrative: [name] is a 38 y.o. male with PMH significant for paroxysmal A-fib on Eliquis who underwent ablation on 3/13. 3/22, patient presented to the ED with complaint of few days of chills and an episode of chest pain worse with inspiration.  In the ED, he had a low-grade temperature 100, slightly elevated WBC count COVID, flu, RSV negative CT angio chest state of possible a right lower lobe subsegmental PE.   His troponin was slightly elevated Cardiology was consulted Chest pain was attributed to pericarditis. Admitted to [facility]  Subjective: Patient was seen and examined this morning.  Pleasant young Caucasian male.  Lying down in bed.  Not in distress.  Wife at bedside. Was also seen by cardiologist Dr. Anne Fu earlier today.  Hospital course: Post ablation pericarditis Presented with chest pain, low-grade fever and chills at home Seen by cardiology. Diagnosed with post ablation pericarditis.  No evidence of pericardial effusion on CT scan or echo this morning Recommended colchicine 0.6 mg twice daily for 3 months.  For acute pain episodes, can take Tylenol as needed.  H/o A-fib S/p ablation 3/13 He has not had any recurrence of A-fib since ablation.   Continue to use metoprolol, flecainide as instructed by cardiologist post ablation.  Abnormal CTA chest CTA chest also suggested a small subsegmental right lower lobe PE. Patient is already on Eliquis since February. Ultrasound duplex of lower extremities were negative for DVT. Patient does not have  any prior history of blood clot or family history of blood clot.  He reports strict compliance to Eliquis.  I agree with that the findings and CT scan most likely is an artifact.  No further studies indicated at this time. If continues to have symptoms, can have repeat CTA chest as an outpatient.  Goals of care   Code Status: Full Code    Diet:  Diet Order             Diet regular Room service appropriate? Yes; Fluid consistency: Thin  Diet effective now           Diet general                   Nutritional status:  Body mass index is 27.8 kg/m.       Wounds:  -    Discharge Exam:   Vitals:   08/14/23 0000 08/14/23 0145 08/14/23 0150 08/14/23 0716  BP: 125/88  118/79 115/71  Pulse: 85  79 79  Resp: 20  18 16   Temp:    98.7 F (37.1 C)  TempSrc:    Oral  SpO2: 99%  93% 96%  Weight:  98.2 kg    Height:  6\' 2"  (1.88 m)      Body mass index is 27.8 kg/m.  General exam: Pleasant, young Caucasian male.  Not in distress Skin: No rashes, lesions or ulcers. HEENT: Atraumatic, normocephalic, no obvious bleeding Lungs: Clear to auscultation bilaterally, hearts in the precordium on deep breathing CVS: S1, S2, no murmur, tender precordium GI/Abd: Soft, nontender, nondistended, bowel sound present,   CNS: Alert, awake, oriented x 3  Psychiatry: Mood appropriate,  Extremities: No pedal edema, no calf tenderness,   Follow ups:    Follow-up Information      [facility] Follow up.   Contact information: [address] [place] [address] [phone]                Discharge Instructions:   Discharge Instructions     Call MD for:  difficulty breathing, headache or visual disturbances   Complete by: As directed    Call MD for:  extreme fatigue   Complete by: As directed    Call MD for:  hives   Complete by: As directed    Call MD for:  persistant dizziness or light-headedness   Complete by:  As directed    Call MD for:  persistant nausea and vomiting   Complete by: As directed    Call MD for:  severe uncontrolled pain   Complete by: As directed    Call MD for:  temperature >100.4   Complete by: As directed    Diet general   Complete by: As directed    Discharge instructions   Complete by: As directed    Recommendations at discharge:   Colchicine 0.6 mg twice daily for 3 months.  As needed Tylenol for pain control.  Continue Eliquis as before  Follow-up with cardiology as an outpatient  General discharge instructions: Follow with Primary MD System, Provider Not In in 7 days  Please request your PCP  to go over your hospital tests, procedures, radiology results at the follow up. Please get your medicines reviewed and adjusted.  Your PCP may decide to repeat certain labs or tests as needed. Do not drive, operate heavy machinery, perform activities at heights, swimming or participation in water activities or provide baby sitting services if your were admitted for syncope or siezures until you have seen by Primary MD or a Neurologist and advised to do so again. [place] Controlled Substance Reporting System database was reviewed. Do not drive, operate heavy machinery, perform activities at heights, swim, participate in water activities or provide baby-sitting services while on medications for pain, sleep and mood until your outpatient physician has reevaluated you and advised to do so again.  You are strongly recommended to comply with the dose, frequency and duration of prescribed medications. Activity: As tolerated with Full fall precautions use walker/cane & assistance as needed Avoid using any recreational substances like cigarette, tobacco, alcohol, or non-prescribed drug. If you experience worsening of your admission symptoms, develop shortness of breath, life threatening emergency, suicidal or homicidal thoughts you must seek medical attention immediately by calling  911 or calling your MD immediately  if symptoms less severe. You must read complete instructions/literature along with all the possible adverse reactions/side effects for all the medicines you take and that have been prescribed to you. Take any new medicine only after you have completely understood and accepted all the possible adverse reactions/side effects.  Wear Seat belts while driving. You were cared for by a hospitalist during your hospital stay. If you have any questions about your discharge medications or the care you received while you were in the hospital after you are discharged, you can call the unit and ask to speak with the hospitalist or the covering physician. Once you are discharged, your primary care physician will handle any further medical issues. Please note that NO REFILLS for any discharge medications will be authorized once you are discharged, as  it is imperative that you return to your primary care physician (or establish a relationship with a primary care physician if you do not have one).   Increase activity slowly   Complete by: As directed        Discharge Medications:   Allergies as of 08/14/2023   No Known Allergies      Medication List     TAKE these medications    acetaminophen 325 MG tablet Commonly known as: TYLENOL Take 2 tablets (650 mg total) by mouth every 6 (six) hours as needed for mild pain (pain score 1-3) (or Fever >/= 101).   apixaban 5 MG Tabs tablet Commonly known as: ELIQUIS Take 1 tablet (5 mg total) by mouth 2 (two) times daily.   colchicine 0.6 MG tablet Take 1 tablet (0.6 mg total) by mouth 2 (two) times daily.   FIBER GUMMIES PO Take 1 tablet by mouth daily as needed (Constipation).   flecainide 100 MG tablet Commonly known as: TAMBOCOR Take 100 mg by mouth as needed (Afib).   Melatonin 5 MG Chew Chew 5-10 mg by mouth at bedtime as needed (sleep).   metoprolol succinate 25 MG 24 hr tablet Commonly known as:  TOPROL-XL Take 25 mg by mouth as needed (Afib).   multivitamin Tabs tablet Take 1 tablet by mouth daily.   sildenafil 100 MG tablet Commonly known as: VIAGRA TAKE 1/2 TO 1 TABLET BY MOUTH EVERY DAY AS NEEDED         The results of significant diagnostics from this hospitalization (including imaging, microbiology, ancillary and laboratory) are listed below for reference.    Procedures and Diagnostic Studies:   ECHOCARDIOGRAM COMPLETE Result Date: 08/14/2023    ECHOCARDIOGRAM REPORT   Patient Name:   [name] Date of Exam: 08/14/2023 Medical Rec #:  [id number]           Height:       74.0 in Accession #:    [id number]          Weight:       216.5 lb Date of Birth:  09-13-1985            BSA:          2.248 m Patient Age:    37 years            BP:           115/71 mmHg Patient Gender: M                   HR:           77 bpm. Exam Location:  Inpatient Procedure: 2D Echo (Both Spectral and Color Flow Doppler were utilized during            procedure). Indications:    Atrial fibrillation. Chest pain.  History:        Patient has prior history of Echocardiogram examinations, most                 recent 10/06/2010. Ablation; Signs/Symptoms:Fever.  Sonographer:    Delcie Roch RDCS Referring Phys: [id number] NKIRU C OSUDE IMPRESSIONS  1. No pericardial effusion (post afib ablation).  2. Left ventricular ejection fraction, by estimation, is 60 to 65%. The left ventricle has normal function. The left ventricle has no regional wall motion abnormalities. Left ventricular diastolic parameters were normal.  3. Right ventricular systolic function is normal. The right ventricular size is normal.  4. The mitral valve is normal in  structure. Trivial mitral valve regurgitation. No evidence of mitral stenosis.  5. The aortic valve is normal in structure. Aortic valve regurgitation is not visualized. No aortic stenosis is present.  6. The inferior vena cava is normal in size with greater than 50%  respiratory variability, suggesting right atrial pressure of 3 mmHg. Conclusion(s)/Recommendation(s): Normal biventricular function without evidence of hemodynamically significant valvular heart disease. FINDINGS  Left Ventricle: Left ventricular ejection fraction, by estimation, is 60 to 65%. The left ventricle has normal function. The left ventricle has no regional wall motion abnormalities. The left ventricular internal cavity size was normal in size. There is  no left ventricular hypertrophy. Left ventricular diastolic parameters were normal. Right Ventricle: The right ventricular size is normal. No increase in right ventricular wall thickness. Right ventricular systolic function is normal. Left Atrium: Left atrial size was normal in size. Right Atrium: Right atrial size was normal in size. Pericardium: There is no evidence of pericardial effusion. Mitral Valve: The mitral valve is normal in structure. Trivial mitral valve regurgitation. No evidence of mitral valve stenosis. Tricuspid Valve: The tricuspid valve is normal in structure. Tricuspid valve regurgitation is trivial. No evidence of tricuspid stenosis. Aortic Valve: The aortic valve is normal in structure. Aortic valve regurgitation is not visualized. No aortic stenosis is present. Pulmonic Valve: The pulmonic valve was normal in structure. Pulmonic valve regurgitation is trivial. No evidence of pulmonic stenosis. Aorta: The aortic root is normal in size and structure. Venous: The inferior vena cava is normal in size with greater than 50% respiratory variability, suggesting right atrial pressure of 3 mmHg. IAS/Shunts: No atrial level shunt detected by color flow Doppler.  LEFT VENTRICLE PLAX 2D LVIDd:         4.80 cm   Diastology LVIDs:         3.40 cm   LV e' medial:    9.90 cm/s LV PW:         1.10 cm   LV E/e' medial:  7.2 LV IVS:        1.00 cm   LV e' lateral:   14.40 cm/s LVOT diam:     2.30 cm   LV E/e' lateral: 4.9 LV SV:         87 LV SV Index:    39 LVOT Area:     4.15 cm  RIGHT VENTRICLE             IVC RV Basal diam:  2.90 cm     IVC diam: 2.00 cm RV S prime:     12.00 cm/s TAPSE (M-mode): 2.7 cm LEFT ATRIUM             Index        RIGHT ATRIUM           Index LA diam:        5.00 cm 2.22 cm/m   RA Area:     20.30 cm LA Vol (A2C):   63.1 ml 28.07 ml/m  RA Volume:   60.70 ml  27.00 ml/m LA Vol (A4C):   66.8 ml 29.72 ml/m LA Biplane Vol: 69.1 ml 30.74 ml/m  AORTIC VALVE             PULMONIC VALVE LVOT Vmax:   109.00 cm/s PR End Diast Vel: 4.58 msec LVOT Vmean:  71.300 cm/s LVOT VTI:    0.210 m  AORTA Ao Root diam: 3.50 cm Ao Asc diam:  3.70 cm MITRAL VALVE MV Area (PHT): 3.91 cm  SHUNTS MV Decel Time: 194 msec    Systemic VTI:  0.21 m MV E velocity: 71.10 cm/s  Systemic Diam: 2.30 cm MV A velocity: 45.70 cm/s MV E/A ratio:  1.56 Donato Schultz MD Electronically signed by Donato Schultz MD Signature Date/Time: 08/14/2023/10:54:04 AM    Final    VAS [place] LOWER EXTREMITY VENOUS (DVT) Result Date: 08/14/2023  Lower Venous DVT Study Patient Name:  [name]  Date of Exam:   08/14/2023 Medical Rec #: [id number]            Accession #:    [id number] Date of Birth: 1985-09-06             Patient Gender: M Patient Age:   86 years Exam Location:  [facility] Procedure:      VAS [place] LOWER EXTREMITY VENOUS (DVT) Referring Phys: Chinita Pester OSUDE --------------------------------------------------------------------------------  Indications: Pulmonary embolism.  Risk Factors: Surgery atrial fibrillation ablation 08/04/23. Anticoagulation: Eliquis. Comparison Study: None. Performing Technologist: Shona Simpson  Examination Guidelines: A complete evaluation includes B-mode imaging, spectral Doppler, color Doppler, and power Doppler as needed of all accessible portions of each vessel. Bilateral testing is considered an integral part of a complete examination. Limited examinations for reoccurring indications may be performed as noted. The reflux portion of the exam  is performed with the patient in reverse Trendelenburg.  +---------+---------------+---------+-----------+----------+--------------+ RIGHT    CompressibilityPhasicitySpontaneityPropertiesThrombus Aging +---------+---------------+---------+-----------+----------+--------------+ CFV      Full           Yes      Yes                                 +---------+---------------+---------+-----------+----------+--------------+ SFJ      Full                                                        +---------+---------------+---------+-----------+----------+--------------+ FV Prox  Full                                                        +---------+---------------+---------+-----------+----------+--------------+ FV Mid   Full                                                        +---------+---------------+---------+-----------+----------+--------------+ FV DistalFull                                                        +---------+---------------+---------+-----------+----------+--------------+ PFV      Full                                                        +---------+---------------+---------+-----------+----------+--------------+  POP      Full           Yes      Yes                                 +---------+---------------+---------+-----------+----------+--------------+ PTV      Full                                                        +---------+---------------+---------+-----------+----------+--------------+ PERO     Full                                                        +---------+---------------+---------+-----------+----------+--------------+   +---------+---------------+---------+-----------+----------+--------------+ LEFT     CompressibilityPhasicitySpontaneityPropertiesThrombus Aging +---------+---------------+---------+-----------+----------+--------------+ CFV      Full           Yes      Yes                                  +---------+---------------+---------+-----------+----------+--------------+ SFJ      Full                                                        +---------+---------------+---------+-----------+----------+--------------+ FV Prox  Full                                                        +---------+---------------+---------+-----------+----------+--------------+ FV Mid   Full                                                        +---------+---------------+---------+-----------+----------+--------------+ FV DistalFull                                                        +---------+---------------+---------+-----------+----------+--------------+ PFV      Full                                                        +---------+---------------+---------+-----------+----------+--------------+ POP      Full           Yes      Yes                                 +---------+---------------+---------+-----------+----------+--------------+  PTV      Full                                                        +---------+---------------+---------+-----------+----------+--------------+ PERO     Full                                                        +---------+---------------+---------+-----------+----------+--------------+    Summary: BILATERAL: - No evidence of deep vein thrombosis seen in the lower extremities, bilaterally. -No evidence of popliteal cyst, bilaterally.   *See table(s) above for measurements and observations.    Preliminary    CT Angio Chest PE W and/or Wo Contrast Result Date: 08/13/2023 CLINICAL DATA:  Pulmonary embolism (PE) suspected, high prob EXAM: CT ANGIOGRAPHY CHEST WITH CONTRAST TECHNIQUE: Multidetector CT imaging of the chest was performed using the standard protocol during bolus administration of intravenous contrast. Multiplanar CT image reconstructions and MIPs were obtained to evaluate the vascular anatomy. RADIATION DOSE  REDUCTION: This exam was performed according to the departmental dose-optimization program which includes automated exposure control, adjustment of the mA and/or kV according to patient size and/or use of iterative reconstruction technique. CONTRAST:  75mL OMNIPAQUE IOHEXOL 350 MG/ML SOLN COMPARISON:  CT heart 07/14/2023 FINDINGS: Cardiovascular: Satisfactory opacification of the pulmonary arteries to the segmental level. Right lower lobe segmental subsegmental pulmonary embolus. Normal heart size. No significant pericardial effusion. The thoracic aorta is normal in caliber. No atherosclerotic plaque of the thoracic aorta. No coronary artery calcifications. Mediastinum/Nodes: No enlarged mediastinal, hilar, or axillary lymph nodes. Thyroid gland, trachea, and esophagus demonstrate no significant findings. Lungs/Pleura: Bibasilar, left greater than right, base atelectasis. No focal consolidation. No pulmonary nodule. No pulmonary mass. No pleural effusion. No pneumothorax. Upper Abdomen: No acute abnormality. Musculoskeletal: No chest wall abnormality.  Bilateral gynecomastia. No suspicious lytic or blastic osseous lesions. No acute displaced fracture. Review of the MIP images confirms the above findings. IMPRESSION: Right lower lobe segmental subsegmental pulmonary embolus. No associated pulmonary infarction or right heart strain. These results were called by telephone at the time of interpretation on 08/13/2023 at 8:30 pm to provider [facility] , who verbally acknowledged these results. Electronically Signed   By: Tish Frederickson M.D.   On: 08/13/2023 20:30   DG Chest 2 View Result Date: 08/13/2023 CLINICAL DATA:  chest pain EXAM: CHEST - 2 VIEW COMPARISON:  Chest x-ray 11/15/2021, CT cardiac 07/14/2023 trauma chest x-ray 01/22/2005 FINDINGS: The heart and mediastinal contours are within normal limits. No focal consolidation. No pulmonary edema. No pleural effusion. No pneumothorax. No acute osseous  abnormality. IMPRESSION: No active cardiopulmonary disease. Electronically Signed   By: Tish Frederickson M.D.   On: 08/13/2023 19:20     Labs:   Basic Metabolic Panel: Recent Labs  Lab 08/13/23 1807 08/14/23 0404  NA 137 138  K 3.5 4.3  CL 101 104  CO2 25 25  GLUCOSE 105* 98  BUN 24* 20  CREATININE 1.14 1.05  CALCIUM 9.4 9.2   GFR Estimated Creatinine Clearance: 110.9 mL/min (by C-G formula based on SCr of 1.05 mg/dL). Liver Function Tests: Recent Labs  Lab 08/14/23 0404  AST 29  ALT 94*  ALKPHOS 58  BILITOT 1.2  PROT 6.5  ALBUMIN 3.5   No results for input(s): "LIPASE", "AMYLASE" in the last 168 hours. No results for input(s): "AMMONIA" in the last 168 hours. Coagulation profile No results for input(s): "INR", "PROTIME" in the last 168 hours.  CBC: Recent Labs  Lab 08/13/23 1807  WBC 15.8*  HGB 14.8  HCT 43.0  MCV 89.6  PLT 327   Cardiac Enzymes: No results for input(s): "CKTOTAL", "CKMB", "CKMBINDEX", "TROPONINI" in the last 168 hours. BNP: Invalid input(s): "POCBNP" CBG: No results for input(s): "GLUCAP" in the last 168 hours. D-Dimer Recent Labs    08/13/23 2112  DDIMER 0.43   Hgb A1c No results for input(s): "HGBA1C" in the last 72 hours. Lipid Profile No results for input(s): "CHOL", "HDL", "LDLCALC", "TRIG", "CHOLHDL", "LDLDIRECT" in the last 72 hours. Thyroid function studies No results for input(s): "TSH", "T4TOTAL", "T3FREE", "THYROIDAB" in the last 72 hours.  Invalid input(s): "FREET3" Anemia work up No results for input(s): "VITAMINB12", "FOLATE", "FERRITIN", "TIBC", "IRON", "RETICCTPCT" in the last 72 hours. Microbiology Recent Results (from the past 240 hours)  Resp panel by RT-PCR (RSV, Flu A&B, Covid) Anterior Nasal Swab     Status: None   Collection Time: 08/13/23  6:06 PM   Specimen: Anterior Nasal Swab  Result Value Ref Range Status   SARS Coronavirus 2 by RT PCR NEGATIVE NEGATIVE Final   Influenza A by PCR NEGATIVE  NEGATIVE Final   Influenza B by PCR NEGATIVE NEGATIVE Final    Comment: (NOTE) The Xpert Xpress SARS-CoV-2/FLU/RSV plus assay is intended as an aid in the diagnosis of influenza from Nasopharyngeal swab specimens and should not be used as a sole basis for treatment. Nasal washings and aspirates are unacceptable for Xpert Xpress SARS-CoV-2/FLU/RSV testing.  Fact Sheet for Patients: BloggerCourse.com  Fact Sheet for Healthcare Providers: SeriousBroker.it  This test is not yet approved or cleared by the [place] FDA and has been authorized for detection and/or diagnosis of SARS-CoV-2 by FDA under an Emergency Use Authorization (EUA). This EUA will remain in effect (meaning this test can be used) for the duration of the COVID-19 declaration under Section 564(b)(1) of the Act, 21 U.S.C. section 360bbb-3(b)(1), unless the authorization is terminated or revoked.     Resp Syncytial Virus by PCR NEGATIVE NEGATIVE Final    Comment: (NOTE) Fact Sheet for Patients: BloggerCourse.com  Fact Sheet for Healthcare Providers: SeriousBroker.it  This test is not yet approved or cleared by the [place] FDA and has been authorized for detection and/or diagnosis of SARS-CoV-2 by FDA under an Emergency Use Authorization (EUA). This EUA will remain in effect (meaning this test can be used) for the duration of the COVID-19 declaration under Section 564(b)(1) of the Act, 21 U.S.C. section 360bbb-3(b)(1), unless the authorization is terminated or revoked.  Performed at [facility], [address] [place], [place] [address]   Culture, blood (routine x 2)     Status: None (Preliminary result)   Collection Time: 08/13/23  7:43 PM   Specimen: BLOOD  Result Value Ref Range Status   Specimen Description BLOOD RIGHT ANTECUBITAL  Final   Special Requests   Final    BOTTLES DRAWN AEROBIC  AND ANAEROBIC Blood Culture adequate volume   Culture   Final    NO GROWTH < 12 HOURS Performed at [facility], [address], [place], [place] [address]    Report Status PENDING  Incomplete  Culture, blood (  routine x 2)     Status: None (Preliminary result)   Collection Time: 08/13/23  7:53 PM   Specimen: BLOOD LEFT ARM  Result Value Ref Range Status   Specimen Description BLOOD LEFT ARM  Final   Special Requests   Final    BOTTLES DRAWN AEROBIC AND ANAEROBIC Blood Culture results may not be optimal due to an inadequate volume of blood received in culture bottles   Culture   Final    NO GROWTH < 12 HOURS Performed at [facility], [address] [place], [place] [address]    Report Status PENDING  Incomplete    Time coordinating discharge: 45 minutes  Signed: Marivel Mcclarty  [facility] 08/14/2023, 11:48 AM

## 2023-08-14 NOTE — Progress Notes (Signed)
 Lower extremity venous duplex completed. Please see CV Procedures for preliminary results.  Shona Simpson, RVT 08/14/23 9:28 AM

## 2023-08-14 NOTE — H&P (Signed)
 History and Physical    Patient: [name] DOB: [birth year] DOA: 08/13/2023 DOS: the patient was seen and examined on 08/14/2023 PCP: System, Provider Not In  Patient coming from: Home  Chief Complaint:  Chief Complaint  Patient presents with   Chest Pain   HPI: [name] is a 38 y.o. male with medical history significant for paroxysmal atrial fibrillation on Eliquis who is status post ablation 10 days ago.  The patient has not had any atrial fibrillation since the ablation.  Today he had some chills and noticed that there was a little chest discomfort at the end of is inhale.  As the day went on it became more uncomfortable for him to take breaths.  He also has a little discomfort when he lays flat or sits forward.   In the emergency department the patient had a low-grade temperature of 100 and a slightly elevated white blood cell count.  He was COVID, flu, and RSV negative.  A CTA  revealed a right lower lobe subsegmental PE.  His troponin was slightly elevated so cardiology was called.  The patient's chest discomfort is consistent with pericarditis. The patient is not hypoxic or short of breath.         Cardiology has recommended admitting the patient to reevaluate the PE and decide which blood thinner should be on.   Review of Systems: As mentioned in the history of present illness. All other systems reviewed and are negative. Past Medical History:  Diagnosis Date   A-fib (HCC)    Heart murmur    Mitral valve prolapse    Paroxysmal atrial fibrillation (HCC)    Testosterone deficiency    Past Surgical History:  Procedure Laterality Date   ATRIAL FIBRILLATION ABLATION N/A 08/04/2023   Procedure: ATRIAL FIBRILLATION ABLATION;  Surgeon: Regan Lemming, MD;  [facility];  Service: Cardiovascular;  Laterality: N/A;   HIP SURGERY     NM MYOCAR PERF WALL MOTION  12/11/2004   [place], MD: no ischemia   SURGERY SCROTAL / TESTICULAR   06/1994   torsion   US ECHOCARDIOGRAPHY  10/06/2010   LA mildly dilated,trace MR   WRIST SURGERY  05/2004   tendon repair   Social History:  reports that he has never smoked. He has never used smokeless tobacco. He reports current alcohol use. He reports that he does not use drugs.  No Known Allergies  Family History  Problem Relation Age of Onset   Cancer Mother    Diabetes Father    Hypertension Father    Hyperlipidemia Father    Cancer Maternal Grandmother    Cancer Paternal Grandmother    Cancer Paternal Grandfather     Prior to Admission medications   Medication Sig Start Date End Date Taking? Authorizing Provider  apixaban (ELIQUIS) 5 MG TABS tablet Take 1 tablet (5 mg total) by mouth 2 (two) times daily. 07/08/23  Yes Camnitz, Will Daphine Deutscher, MD  FIBER GUMMIES PO Take 1 tablet by mouth daily as needed (Constipation).   Yes [provider]  flecainide (TAMBOCOR) 100 MG tablet Take 100 mg by mouth as needed (Afib). 02/28/23   [provider]  Melatonin 5 MG CHEW Chew 5-10 mg by mouth at bedtime as needed (sleep).    [provider]  metoprolol succinate (TOPROL-XL) 25 MG 24 hr tablet Take 25 mg by mouth as needed (Afib). 02/28/23   [provider]  multivitamin (ONE-A-DAY MEN'S) TABS tablet Take 1 tablet  by mouth daily. 07/06/21   [provider]  sildenafil (VIAGRA) 100 MG tablet TAKE 1/2 TO 1 TABLET BY MOUTH EVERY DAY AS NEEDED 12/04/20   Shade Flood, MD    Physical Exam: Vitals:   08/13/23 1845 08/13/23 1851 08/13/23 2000 08/13/23 2235  BP: 130/80  123/82 121/86  Pulse: (!) 107  100 88  Resp: 18  19 18   Temp:    99.4 F (37.4 C)  TempSrc:    Oral  SpO2: 96% 96% 95% 96%  Weight:      Height:       Physical Exam:  General: No acute distress, well developed, well nourished HEENT: Normocephalic, atraumatic, PERRL Cardiovascular: Normal rate and rhythm. Distal pulses intact. Pulmonary: Normal pulmonary effort, normal breath  sounds Gastrointestinal: Nondistended abdomen, soft, non-tender, normoactive bowel sounds, no organomegaly Musculoskeletal:Normal ROM, no lower ext edema Lymphadenopathy: No cervical LAD. Skin: Skin is warm and dry. Neuro: No focal deficits noted, AAOx3. PSYCH: Attentive and cooperative  Data Reviewed:  Results for orders placed or performed during the hospital encounter of 08/13/23 (from the past 24 hours)  Resp panel by RT-PCR (RSV, Flu A&B, Covid) Anterior Nasal Swab     Status: None   Collection Time: 08/13/23  6:06 PM   Specimen: Anterior Nasal Swab  Result Value Ref Range   SARS Coronavirus 2 by RT PCR NEGATIVE NEGATIVE   Influenza A by PCR NEGATIVE NEGATIVE   Influenza B by PCR NEGATIVE NEGATIVE   Resp Syncytial Virus by PCR NEGATIVE NEGATIVE  Basic metabolic panel     Status: Abnormal   Collection Time: 08/13/23  6:07 PM  Result Value Ref Range   Sodium 137 135 - 145 mmol/L   Potassium 3.5 3.5 - 5.1 mmol/L   Chloride 101 98 - 111 mmol/L   CO2 25 22 - 32 mmol/L   Glucose, Bld 105 (H) 70 - 99 mg/dL   BUN 24 (H) 6 - 20 mg/dL   Creatinine, Ser 0.98 0.61 - 1.24 mg/dL   Calcium 9.4 8.9 - 11.9 mg/dL   GFR, Estimated >14 >78 mL/min   Anion gap 11 5 - 15  CBC     Status: Abnormal   Collection Time: 08/13/23  6:07 PM  Result Value Ref Range   WBC 15.8 (H) 4.0 - 10.5 K/uL   RBC 4.80 4.22 - 5.81 MIL/uL   Hemoglobin 14.8 13.0 - 17.0 g/dL   HCT 29.5 62.1 - 30.8 %   MCV 89.6 80.0 - 100.0 fL   MCH 30.8 26.0 - 34.0 pg   MCHC 34.4 30.0 - 36.0 g/dL   RDW 65.7 84.6 - 96.2 %   Platelets 327 150 - 400 K/uL   nRBC 0.0 0.0 - 0.2 %  Troponin I (High Sensitivity)     Status: Abnormal   Collection Time: 08/13/23  6:07 PM  Result Value Ref Range   Troponin I (High Sensitivity) 92 (H) <18 ng/L  Troponin I (High Sensitivity)     Status: Abnormal   Collection Time: 08/13/23  7:52 PM  Result Value Ref Range   Troponin I (High Sensitivity) 81 (H) <18 ng/L  I-Stat CG4 Lactic Acid      Status: None   Collection Time: 08/13/23  8:01 PM  Result Value Ref Range   Lactic Acid, Venous 0.9 0.5 - 1.9 mmol/L  Sedimentation rate     Status: None   Collection Time: 08/13/23  9:12 PM  Result Value Ref Range   Sed Rate  15 0 - 16 mm/hr  C-reactive protein     Status: None   Collection Time: 08/13/23  9:12 PM  Result Value Ref Range   CRP 0.7 <1.0 mg/dL  D-dimer, quantitative     Status: None   Collection Time: 08/13/23  9:12 PM  Result Value Ref Range   D-Dimer, Quant 0.43 0.00 - 0.50 ug/mL-FEU  I-Stat CG4 Lactic Acid     Status: None   Collection Time: 08/13/23 10:48 PM  Result Value Ref Range   Lactic Acid, Venous 0.7 0.5 - 1.9 mmol/L     Assessment and Plan:  Symptomatic Pericarditis 10 days post Ablation - Colchicine ordered  2. RLL Subsegmental PE -the patient has been on a month and half.  He was instructed to not take the Eliquis the morning of his ablation but he did take it that night and has not missed a single dose.  Appreciate cardiology input.  He recommends repeating the CTA to be certain of the PE.  Since the patient has not missed any Eliquis, if there is a PE he recommends switching to warfarin which is reasonable. The other possibility is that the PE was there prior to the start of the Eliquis.  The patient used to have infrequent of A-fib, usually when he would drink alcohol, but he had an episode of A-fib unprovoked by alcohol.  That is when he decided to have the ablation done.  Perhaps the patient had a PE and that led to the unprovoked A-fib episode at the end of last year.  - Will order another CTA vs V/Q - Continue Eliquis for now     Advance Care Planning:   Code Status: Full Code the patient will be full code by default.  Consults: cardiology  Family Communication: wife at bedside  Severity of Illness: The appropriate patient status for this patient is OBSERVATION. Observation status is judged to be reasonable and necessary in order to provide  the required intensity of service to ensure the patient's safety. The patient's presenting symptoms, physical exam findings, and initial radiographic and laboratory data in the context of their medical condition is felt to place them at decreased risk for further clinical deterioration. Furthermore, it is anticipated that the patient will be medically stable for discharge from the hospital within 2 midnights of admission.   Author: Buena Irish, MD 08/14/2023 12:18 AM  For on call review [email or web address].

## 2023-08-14 NOTE — ED Notes (Signed)
 ED TO INPATIENT HANDOFF REPORT  ED Nurse Name and Phone #: Hansel Starling [phone]  S Name/Age/Gender [name] 38 y.o. male Room/Bed: 035C/035C  Code Status   Code Status: Full Code  Home/SNF/Other Home Patient oriented to: self, place, time, and situation Is this baseline? Yes   Triage Complete: Triage complete  Chief Complaint Pericarditis [I31.9]  Triage Note Pt c/o central chest pain starting this morning.  Pain score 8/10 w/ inhalation.  Pt reports having an ablation on 3/13.   Allergies No Known Allergies  Level of Care/Admitting Diagnosis ED Disposition     ED Disposition  Admit   Condition  --   Comment  Hospital Area: [facility] [[id number]]  Level of Care: Telemetry Cardiac [103]  May place patient in observation at [facility] or [facility] if equivalent level of care is available:: No  Covid Evaluation: Confirmed COVID Negative  Diagnosis: Pericarditis [203047]  Admitting Physician: Buena Irish [[id number]]  Attending Physician: Buena Irish [[id number]]          B Medical/Surgery History Past Medical History:  Diagnosis Date   A-fib (HCC)    Heart murmur    Mitral valve prolapse    Paroxysmal atrial fibrillation (HCC)    Testosterone deficiency    Past Surgical History:  Procedure Laterality Date   ATRIAL FIBRILLATION ABLATION N/A 08/04/2023   Procedure: ATRIAL FIBRILLATION ABLATION;  Surgeon: Regan Lemming, MD;  Location[facility];  Service: Cardiovascular;  Laterality: N/A;   HIP SURGERY     NM MYOCAR PERF WALL MOTION  12/11/2004   [place], MD: no ischemia   SURGERY SCROTAL / TESTICULAR  06/1994   torsion   US ECHOCARDIOGRAPHY  10/06/2010   LA mildly dilated,trace MR   WRIST SURGERY  05/2004   tendon repair     A IV Location/Drains/Wounds Patient Lines/Drains/Airways Status     Active Line/Drains/Airways     Name Placement date Placement time Site Days   Peripheral IV 08/13/23 18 G Left  Antecubital 08/13/23  1847  Antecubital  1            Intake/Output Last 24 hours  Intake/Output Summary (Last 24 hours) at 08/14/2023 0020 Last data filed at 08/13/2023 1956 Gross per 24 hour  Intake 1000 ml  Output --  Net 1000 ml    Labs/Imaging Results for orders placed or performed during the hospital encounter of 08/13/23 (from the past 48 hours)  Resp panel by RT-PCR (RSV, Flu A&B, Covid) Anterior Nasal Swab     Status: None   Collection Time: 08/13/23  6:06 PM   Specimen: Anterior Nasal Swab  Result Value Ref Range   SARS Coronavirus 2 by RT PCR NEGATIVE NEGATIVE   Influenza A by PCR NEGATIVE NEGATIVE   Influenza B by PCR NEGATIVE NEGATIVE    Comment: (NOTE) The Xpert Xpress SARS-CoV-2/FLU/RSV plus assay is intended as an aid in the diagnosis of influenza from Nasopharyngeal swab specimens and should not be used as a sole basis for treatment. Nasal washings and aspirates are unacceptable for Xpert Xpress SARS-CoV-2/FLU/RSV testing.  Fact Sheet for Patients: BloggerCourse.com  Fact Sheet for Healthcare Providers: SeriousBroker.it  This test is not yet approved or cleared by the [place] FDA and has been authorized for detection and/or diagnosis of SARS-CoV-2 by FDA under an Emergency Use Authorization (EUA). This EUA will remain in effect (meaning this test can be used) for the duration of the COVID-19 declaration under Section 564(b)(1) of the  Act, 21 U.S.C. section 360bbb-3(b)(1), unless the authorization is terminated or revoked.     Resp Syncytial Virus by PCR NEGATIVE NEGATIVE    Comment: (NOTE) Fact Sheet for Patients: BloggerCourse.com  Fact Sheet for Healthcare Providers: SeriousBroker.it  This test is not yet approved or cleared by the [place] FDA and has been authorized for detection and/or diagnosis of SARS-CoV-2 by FDA under an  Emergency Use Authorization (EUA). This EUA will remain in effect (meaning this test can be used) for the duration of the COVID-19 declaration under Section 564(b)(1) of the Act, 21 U.S.C. section 360bbb-3(b)(1), unless the authorization is terminated or revoked.  Performed at [facility], [address], [place], [place] [address]   Basic metabolic panel     Status: Abnormal   Collection Time: 08/13/23  6:07 PM  Result Value Ref Range   Sodium 137 135 - 145 mmol/L   Potassium 3.5 3.5 - 5.1 mmol/L   Chloride 101 98 - 111 mmol/L   CO2 25 22 - 32 mmol/L   Glucose, Bld 105 (H) 70 - 99 mg/dL    Comment: Glucose reference range applies only to samples taken after fasting for at least 8 hours.   BUN 24 (H) 6 - 20 mg/dL   Creatinine, Ser 9.14 0.61 - 1.24 mg/dL   Calcium 9.4 8.9 - 78.2 mg/dL   GFR, Estimated >95 >62 mL/min    Comment: (NOTE) Calculated using the CKD-EPI Creatinine Equation (2021)    Anion gap 11 5 - 15    Comment: Performed at [facility], [address] [place], [place] [address]  CBC     Status: Abnormal   Collection Time: 08/13/23  6:07 PM  Result Value Ref Range   WBC 15.8 (H) 4.0 - 10.5 K/uL   RBC 4.80 4.22 - 5.81 MIL/uL   Hemoglobin 14.8 13.0 - 17.0 g/dL   HCT 57.8 46.9 - 62.9 %   MCV 89.6 80.0 - 100.0 fL   MCH 30.8 26.0 - 34.0 pg   MCHC 34.4 30.0 - 36.0 g/dL   RDW 52.8 41.3 - 24.4 %   Platelets 327 150 - 400 K/uL   nRBC 0.0 0.0 - 0.2 %    Comment: Performed at [facility], [address] [place], [place] [address]  Troponin I (High Sensitivity)     Status: Abnormal   Collection Time: 08/13/23  6:07 PM  Result Value Ref Range   Troponin I (High Sensitivity) 92 (H) <18 ng/L    Comment: (NOTE) Elevated high sensitivity troponin I (hsTnI) values and significant  changes across serial measurements may suggest ACS but many other  chronic and acute conditions are known to elevate hsTnI results.  Refer to the "Links" section for chest  pain algorithms and additional  guidance. Performed at [facility], [address], [place], [place] [address]   Troponin I (High Sensitivity)     Status: Abnormal   Collection Time: 08/13/23  7:52 PM  Result Value Ref Range   Troponin I (High Sensitivity) 81 (H) <18 ng/L    Comment: (NOTE) Elevated high sensitivity troponin I (hsTnI) values and significant  changes across serial measurements may suggest ACS but many other  chronic and acute conditions are known to elevate hsTnI results.  Refer to the "Links" section for chest pain algorithms and additional  guidance. Performed at [facility], [address] [place], [place] [address]   I-Stat CG4 Lactic Acid  Status: None   Collection Time: 08/13/23  8:01 PM  Result Value Ref Range   Lactic Acid, Venous 0.9 0.5 - 1.9 mmol/L  Sedimentation rate     Status: None   Collection Time: 08/13/23  9:12 PM  Result Value Ref Range   Sed Rate 15 0 - 16 mm/hr    Comment: Performed at [facility], [address] [place], [place] [address]  C-reactive protein     Status: None   Collection Time: 08/13/23  9:12 PM  Result Value Ref Range   CRP 0.7 <1.0 mg/dL    Comment: Performed at [facility], [address], [place], [place] [address]  D-dimer, quantitative     Status: None   Collection Time: 08/13/23  9:12 PM  Result Value Ref Range   D-Dimer, Quant 0.43 0.00 - 0.50 ug/mL-FEU    Comment: (NOTE) At the manufacturer cut-off value of 0.5 g/mL FEU, this assay has a negative predictive value of 95-100%.This assay is intended for use in conjunction with a clinical pretest probability (PTP) assessment model to exclude pulmonary embolism (PE) and deep venous thrombosis (DVT) in outpatients suspected of PE or DVT. Results should be correlated with clinical presentation. Performed at [facility], [address], [place], [place] [address]   I-Stat CG4 Lactic Acid     Status: None   Collection Time:  08/13/23 10:48 PM  Result Value Ref Range   Lactic Acid, Venous 0.7 0.5 - 1.9 mmol/L   CT Angio Chest PE W and/or Wo Contrast Result Date: 08/13/2023 CLINICAL DATA:  Pulmonary embolism (PE) suspected, high prob EXAM: CT ANGIOGRAPHY CHEST WITH CONTRAST TECHNIQUE: Multidetector CT imaging of the chest was performed using the standard protocol during bolus administration of intravenous contrast. Multiplanar CT image reconstructions and MIPs were obtained to evaluate the vascular anatomy. RADIATION DOSE REDUCTION: This exam was performed according to the departmental dose-optimization program which includes automated exposure control, adjustment of the mA and/or kV according to patient size and/or use of iterative reconstruction technique. CONTRAST:  75mL OMNIPAQUE IOHEXOL 350 MG/ML SOLN COMPARISON:  CT heart 07/14/2023 FINDINGS: Cardiovascular: Satisfactory opacification of the pulmonary arteries to the segmental level. Right lower lobe segmental subsegmental pulmonary embolus. Normal heart size. No significant pericardial effusion. The thoracic aorta is normal in caliber. No atherosclerotic plaque of the thoracic aorta. No coronary artery calcifications. Mediastinum/Nodes: No enlarged mediastinal, hilar, or axillary lymph nodes. Thyroid gland, trachea, and esophagus demonstrate no significant findings. Lungs/Pleura: Bibasilar, left greater than right, base atelectasis. No focal consolidation. No pulmonary nodule. No pulmonary mass. No pleural effusion. No pneumothorax. Upper Abdomen: No acute abnormality. Musculoskeletal: No chest wall abnormality.  Bilateral gynecomastia. No suspicious lytic or blastic osseous lesions. No acute displaced fracture. Review of the MIP images confirms the above findings. IMPRESSION: Right lower lobe segmental subsegmental pulmonary embolus. No associated pulmonary infarction or right heart strain. These results were called by telephone at the time of interpretation on 08/13/2023 at  8:30 pm to provider [facility] , who verbally acknowledged these results. Electronically Signed   By: Tish Frederickson M.D.   On: 08/13/2023 20:30   DG Chest 2 View Result Date: 08/13/2023 CLINICAL DATA:  chest pain EXAM: CHEST - 2 VIEW COMPARISON:  Chest x-ray 11/15/2021, CT cardiac 07/14/2023 trauma chest x-ray 01/22/2005 FINDINGS: The heart and mediastinal contours are within normal limits. No focal consolidation. No pulmonary edema. No pleural effusion. No pneumothorax. No acute osseous abnormality. IMPRESSION: No active cardiopulmonary  disease. Electronically Signed   By: Tish Frederickson M.D.   On: 08/13/2023 19:20    Pending Labs Unresulted Labs (From admission, onward)     Start     Ordered   08/14/23 0500  Comprehensive metabolic panel  Tomorrow morning,   R        08/14/23 0018   08/14/23 0017  HIV Antibody (routine testing w rflx)  (HIV Antibody (Routine testing w reflex) panel)  Once,   R        08/14/23 0018   08/13/23 2315  Urinalysis, Routine w reflex microscopic -Urine, Clean Catch  Once,   URGENT       Question:  Specimen Source  Answer:  Urine, Clean Catch   08/13/23 2314   08/13/23 1916  Culture, blood (routine x 2)  BLOOD CULTURE X 2,   R (with STAT occurrences)      08/13/23 1915            Vitals/Pain Today's Vitals   08/13/23 1851 08/13/23 2000 08/13/23 2235 08/14/23 0000  BP:  123/82 121/86 125/88  Pulse:  100 88 85  Resp:  19 18 20   Temp:   99.4 F (37.4 C)   TempSrc:   Oral   SpO2: 96% 95% 96% 99%  Weight:      Height:      PainSc:   3  3     Isolation Precautions No active isolations  Medications Medications  colchicine tablet 0.6 mg (0.6 mg Oral Given 08/13/23 2240)  acetaminophen (TYLENOL) tablet 650 mg (has no administration in time range)    Or  acetaminophen (TYLENOL) suppository 650 mg (has no administration in time range)  ondansetron (ZOFRAN) tablet 4 mg (has no administration in time range)    Or  ondansetron (ZOFRAN) injection  4 mg (has no administration in time range)  sodium chloride 0.9 % bolus 1,000 mL (0 mLs Intravenous Stopped 08/13/23 1956)  ketorolac (TORADOL) 30 MG/ML injection 30 mg (30 mg Intravenous Given 08/13/23 1849)  acetaminophen (TYLENOL) tablet 1,000 mg (1,000 mg Oral Given 08/13/23 1844)  iohexol (OMNIPAQUE) 350 MG/ML injection 75 mL (75 mLs Intravenous Contrast Given 08/13/23 2015)    Mobility walks     Focused Assessments Cardiac Assessment Handoff:  Cardiac Rhythm: Normal sinus rhythm (ADMIT STRIP) Lab Results  Component Value Date   CKTOTAL 174 12/29/2011   CKMB 1.0 09/20/2010   TROPONINI 0.01        NO INDICATION OF MYOCARDIAL INJURY. 09/20/2010   Lab Results  Component Value Date   DDIMER 0.43 08/13/2023   Does the Patient currently have chest pain? Yes    R Recommendations: See Admitting Provider Note  Report given to:   Additional Notes: Spouse at bedside.

## 2023-08-14 NOTE — Progress Notes (Signed)
  Echocardiogram 2D Echocardiogram has been performed.  [name] 08/14/2023, 10:45 AM

## 2023-08-14 NOTE — Progress Notes (Signed)
   Patient Name: [name] Date of Encounter: 08/14/2023 [facility] Cardiologist: Thurmon Fair, MD   Interval Summary  .    Since the ablation on March 13 he has noted some low-grade fevers of approxione 100, has been taking Tylenol to address the fevers.  He had new onset chest pain yesterday worse with inspiration.  When he was laying flat for the CT scan he did feel as though it was exacerbated by this.  No orthopnea no syncope no lower extremity edema.  Feels better. Saw him in vascular lab.  Vital Signs .    Vitals:   08/14/23 0000 08/14/23 0145 08/14/23 0150 08/14/23 0716  BP: 125/88  118/79 115/71  Pulse: 85  79 79  Resp: 20  18 16   Temp:    98.7 F (37.1 C)  TempSrc:    Oral  SpO2: 99%  93% 96%  Weight:  98.2 kg    Height:  6\' 2"  (1.88 m)      Intake/Output Summary (Last 24 hours) at 08/14/2023 0858 Last data filed at 08/14/2023 0400 Gross per 24 hour  Intake 1148.62 ml  Output --  Net 1148.62 ml      08/14/2023    1:45 AM 08/13/2023    6:00 PM 08/04/2023    6:02 AM  Last 3 Weights  Weight (lbs) 216 lb 8 oz 215 lb 215 lb  Weight (kg) 98.204 kg 97.523 kg 97.523 kg      Telemetry/ECG    Sinus rhythm 70s- Personally Reviewed  Physical Exam .   GEN: No acute distress.   Neck: No JVD Cardiac: RRR, no murmurs, rubs, or gallops.  Respiratory: Clear to auscultation bilaterally. GI: Soft, nontender, non-distended  MS: No edema  Assessment & Plan .     38 year old status post atrial fibrillation ablation on 08/04/2023 with post ablation mild fever, pleuritic chest pain.  Post ablation pleuritic pain, minimally elevated troponin 81, no evidence of pericardial effusion - This is post ablation pericarditis. - Recommend colchicine 0.6 mg twice a day for 3 months.  With anti-inflammatory this should improve. - Inflammatory marker CRP is 0.7, normal D-dimer normal at 0.43 -Tmax 100.  Satting well on room air white count 15.8.  There is no  pericardial effusion on CT scan personally reviewed and interpreted.  Abnormal chest CTA - Small subsegmental right lower lobe PE noted by radiology.  Agree with earlier consultation that this may very well be artifactual given degree of contrast loading in that region.  I do not strongly believe that this is an Eliquis failure.  I would continue with Eliquis.  He has not missed any doses. - If lower extremity Dopplers are negative for DVT, I think his overall risk is very low.  I would continue with Eliquis, colchicine has been added for pericarditis.  I do not think that we need to repeat CT scan.  Given the very small nature of this possible subsegmental PE, I do not think a VQ scan would be of much utility.  If Dopplers and ECHO are normal, I would be comfortable with discharge with continued clinic follow-up.  Discussed with the patient.  All questions answered.  Understands.  For questions or updates, please contact [facility] Please consult [email or web address] for contact info under        Signed, Donato Schultz, MD

## 2023-08-14 NOTE — Progress Notes (Signed)
 Seen and examined the patient this afternoon again.Marland Kitchen Discharge order was placed earlier.  Patient however still had concern about the mention of PE in the CT angio chest from last night. I called and discussed with on-call radiologist who reviewed the scan. He thinks the findings likely represents an area of local inflammation around the mentioned subsegmental pulmonary artery.  He advised to repeat CT angio chest in the next 2 to 4 weeks.  No real benefit of repeating it today or tomorrow. Patient is already on Eliquis. I went to the patient's room and counseled him and his wife again. He understands and are now comfortable with the plan of discharge.  While I was talking to him, I saw a flutter on the monitor but was rate controlled. I called and discussed with Dr. Anne Fu about patient's mention of palpitation and the findings of controlled A flutter.  He suggested to continue the previous strategy of as needed metoprolol. Follow-up with cardiology as an outpatient. Plan communicated to bedside RN.

## 2023-08-15 NOTE — Telephone Encounter (Signed)
 Left message to call back

## 2023-08-18 LAB — CULTURE, BLOOD (ROUTINE X 2)
Culture: NO GROWTH
Culture: NO GROWTH
Special Requests: ADEQUATE

## 2023-08-22 NOTE — Telephone Encounter (Signed)
 Spoke to pt.  Aware will work on getting him the signed copy tomorrow.  He is agreeable to plan

## 2023-08-23 NOTE — Telephone Encounter (Signed)
 Late entry:  Spoke to pt last night.  Pt made aware that I would have a signed clearance to return to the gym sent to him today.

## 2023-08-23 NOTE — Telephone Encounter (Signed)
 Spoke to pt and informed that signed letter successfully sent

## 2023-09-01 ENCOUNTER — Ambulatory Visit (HOSPITAL_COMMUNITY)
Admission: RE | Admit: 2023-09-01 | Discharge: 2023-09-01 | Disposition: A | Discharge status: Home / Self Care | Source: Ambulatory Visit | Attending: Internal Medicine | Admitting: Internal Medicine

## 2023-09-01 VITALS — BP 112/80 | HR 65 | Ht 74.0 in | Wt 224.0 lb

## 2023-09-01 DIAGNOSIS — I480 Paroxysmal atrial fibrillation: Secondary | ICD-10-CM

## 2023-09-01 DIAGNOSIS — Z7901 Long term (current) use of anticoagulants: Secondary | ICD-10-CM | POA: Insufficient documentation

## 2023-09-01 NOTE — Progress Notes (Signed)
 Primary Care Physician: System, Provider Not In Primary Cardiologist: Thurmon Fair, MD Electrophysiologist: Will Jorja Loa, MD     Referring Physician: Dr. Cory Roughen is a 38 y.o. male with a history of paroxysmal atrial fibrillation who presents for consultation in the [facility]. Patient is on Eliquis 5 mg BID for a CHADS2VASC score of zero.  On evaluation today, patient is currently in NSR. S/p Afib ablation on 08/04/23 by Dr. Elberta Fortis. Hospital admission 3/22-23/25 for chills, chest pain, and low grade fever following ablation. 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 Recommended colchicine 0.6 mg BID x 3 months. Echo and LE ultrasound both reassuring. Concern for chest CT showing possible PE but ultimately thought to be most likely artifact. He is feeling better since being on colchicine. He is back to physical activity and tolerating exercise without issue. No chest pain or SOB. Leg sites healed without issue. No missed doses of anticoagulant.  Today, he denies symptoms of orthopnea, PND, lower extremity edema, dizziness, presyncope, syncope, snoring, daytime somnolence, bleeding, or neurologic sequela. The patient is tolerating medications without difficulties and is otherwise without complaint today.    he has a BMI of Body mass index is 28.76 kg/m.Marland Kitchen Filed Weights   09/01/23 1050  Weight: 101.6 kg    Current Outpatient Medications  Medication Sig Dispense Refill   acetaminophen (TYLENOL) 325 MG tablet Take 2 tablets (650 mg total) by mouth every 6 (six) hours as needed for mild pain (pain score 1-3) (or Fever >/= 101). (Patient taking differently: Take 650 mg by mouth as needed for mild pain (pain score 1-3) (or Fever >/= 101).)     apixaban (ELIQUIS) 5 MG TABS tablet Take 1 tablet (5 mg total) by mouth 2 (two) times daily. 60 tablet 6   colchicine 0.6 MG tablet Take 1 tablet (0.6 mg  total) by mouth 2 (two) times daily. 180 tablet 0   FIBER GUMMIES PO Take 1 tablet by mouth daily as needed (Constipation).     flecainide (TAMBOCOR) 100 MG tablet Take 100 mg by mouth as needed (Afib).     Melatonin 5 MG CHEW Chew 5-10 mg by mouth at bedtime as needed (sleep).     metoprolol succinate (TOPROL-XL) 25 MG 24 hr tablet Take 25 mg by mouth as needed (Afib).     multivitamin (ONE-A-DAY MEN'S) TABS tablet Take 1 tablet by mouth daily.     sildenafil (VIAGRA) 100 MG tablet TAKE 1/2 TO 1 TABLET BY MOUTH EVERY DAY AS NEEDED 5 tablet 5   No current facility-administered medications for this encounter.    Atrial Fibrillation Management history:  Previous antiarrhythmic drugs: flecainide (PRN) Previous cardioversions: none Previous ablations: 08/04/23 Anticoagulation history: Eliquis   ROS- All systems are reviewed and negative except as per the HPI above.  Physical Exam: BP 112/80   Pulse 65   Ht 6\' 2"  (1.88 m)   Wt 101.6 kg   BMI 28.76 kg/m   GEN: Well nourished, well developed in no acute distress NECK: No JVD; No carotid bruits CARDIAC: Regular rate and rhythm, no murmurs, rubs, gallops RESPIRATORY:  Clear to auscultation without rales, wheezing or rhonchi  ABDOMEN: Soft, non-tender, non-distended EXTREMITIES:  No edema; No deformity   EKG today demonstrates  Vent. rate 65 BPM PR interval 158 ms QRS duration 100 ms QT/QTcB 386/401 ms P-R-T axes 33 64 24 Normal sinus rhythm Normal ECG When compared with ECG  of 13-Aug-2023 17:53, PREVIOUS ECG IS PRESENT  Echo 08/14/23 demonstrated  1. No pericardial effusion (post afib ablation).   2. Left ventricular ejection fraction, by estimation, is 60 to 65%. The  left ventricle has normal function. The left ventricle has no regional  wall motion abnormalities. Left ventricular diastolic parameters were  normal.   3. Right ventricular systolic function is normal. The right ventricular  size is normal.   4. The mitral  valve is normal in structure. Trivial mitral valve  regurgitation. No evidence of mitral stenosis.   5. The aortic valve is normal in structure. Aortic valve regurgitation is  not visualized. No aortic stenosis is present.   6. The inferior vena cava is normal in size with greater than 50%  respiratory variability, suggesting right atrial pressure of 3 mmHg.   Conclusion(s)/Recommendation(s): Normal biventricular function without  evidence of hemodynamically significant valvular heart disease.    ASSESSMENT & PLAN CHA2DS2-VASc Score = 0  The patient's score is based upon: CHF History: 0 HTN History: 0 Diabetes History: 0 Stroke History: 0 Vascular Disease History: 0 Age Score: 0 Gender Score: 0       ASSESSMENT AND PLAN: Paroxysmal Atrial Fibrillation (ICD10:  I48.0) The patient's CHA2DS2-VASc score is 0, indicating a 0.2% annual risk of stroke.   S/p Afib ablation on 08/04/23 by Dr. Elberta Fortis.  He is currently in NSR. Continue Eliquis without interruption.   Follow up with EP as scheduled.    Lake Bells, PA-C  Afib Clinic [facility] [address] [place], [place] [address] [phone]

## 2023-10-30 NOTE — Progress Notes (Unsigned)
 Cardiology Office Note:  .   Date:  10/30/2023  ID:  [name], DOB [birth year], MRN [id number] PCP: System, Provider Not In  [facility] Providers Cardiologist:  Luana Rumple, MD Electrophysiologist:  Will Cortland Ding, MD {  History of Present Illness: .   [name] is a 38 y.o. male w/PMHx of  AFib  Referred to Dr. Lawana Pray for AFib management strategy, saw him 04/11/23, remotely AFib episodes triggered by ETOH though more recently having some episodes with no ETOH consumption. Flecainide initial dose unsuccessful >> 300mg  dose > AFlutter > SR Planned for ablation, started on Eliquis   Ablation 08/04/23  Admitted 08/13/23 c/o CP and low grade fever (100), CT angio chest state of possible a right lower lobe subsegmental PE  CP felt post ablation pericarditis, stable echo, treated with colchicine  (for 3 mo) LE venous study neg for DVT b/l > ultimately CT finding felt to be an artifact give his reports of strict compliance with his Eliquis . Discharged 08/14/23  Saw the Afib clinic team 09/01/23, feeling well, no ongoing symptoms, no changes were made   Today's visit is scheduled as his post ablation visit ROS:   He comes today accompanied by his wife He feels very well, goes to the gym daily, and some days twice Reports excellent exertional capacity No ongoing CP No SOB, DOE No symptoms of his AFib No near syncope or syncope  Arrhythmia/AAD hx AFib 2012 Flecainide pill in the pocket strategy 2023 AFib ablation 08/04/23  Studies Reviewed: Aaron Aas    EKG done today and reviewed by myself:  SR 65bpm,   08/14/2023: [facility]  1. No pericardial effusion (post afib ablation).   2. Left ventricular ejection fraction, by estimation, is 60 to 65%. The  left ventricle has normal function. The left ventricle has no regional  wall motion abnormalities. Left ventricular diastolic parameters were  normal.   3. Right ventricular systolic function is normal. The right  ventricular  size is normal.   4. The mitral valve is normal in structure. Trivial mitral valve  regurgitation. No evidence of mitral stenosis.   5. The aortic valve is normal in structure. Aortic valve regurgitation is  not visualized. No aortic stenosis is present.   6. The inferior vena cava is normal in size with greater than 50%  respiratory variability, suggesting right atrial pressure of 3 mmHg.   Conclusion(s)/Recommendation(s): Normal biventricular function without  evidence of hemodynamically significant valvular heart disease.   08/04/23: EPS/ablation CONCLUSIONS: 1. Sinus rhythm upon presentation.   2. Successful electrical isolation and anatomical encircling of all four pulmonary veins with pulse field ablation. 3. No early apparent complications.   07/14/23: cardiac CT IMPRESSION: 1. There is normal pulmonary vein drainage into the left atrium with ostial measurements above. 2. There is no thrombus in the left atrial appendage. 3. The esophagus runs in proximity to the right lower pulmonary vein ostium. 4. No PFO/ASD. 5. Normal coronary origin. Right dominance. 6. CAC score of 0 which is 0 percentile for age-, race-, and sex-matched controls.   Risk Assessment/Calculations:    Physical Exam:   VS:  There were no vitals taken for this visit.   Wt Readings from Last 3 Encounters:  09/01/23 224 lb (101.6 kg)  08/14/23 216 lb 8 oz (98.2 kg)  08/04/23 215 lb (97.5 kg)    GEN: Well nourished, well developed in no acute distress NECK: No JVD; No carotid bruits CARDIAC: RRR, no murmurs, rubs, gallops RESPIRATORY:  CTA  b/l without rales, wheezing or rhonchi  ABDOMEN: Soft, non-tender, non-distended EXTREMITIES: No edema; No deformity   ASSESSMENT AND PLAN: .    paroxysmal AFib CHA2DS2Vasc is zero, on eliquis  PRN flecainide CHA2Ds2Vasc is zero no burden post ablation Last day of Eliquis  11/04/23  > stop OK to stop colchicine   2. Family hx of FLNC gene  mutations His Mom died of breast cancer, though her father (his grandfather) and her siblings had the gene mutation, and perhaps 2 died fairly young or had cardiac events He would like to have genetic w/u evaluation Will refer to Dr. Drexel Gentles   Dispo: back in 75mo, sooner if needed  Signed, Dameka Younker Randol Butt, PA-C

## 2023-11-01 ENCOUNTER — Ambulatory Visit: Attending: Cardiology | Admitting: Physician Assistant

## 2023-11-01 ENCOUNTER — Encounter: Payer: Self-pay | Admitting: Physician Assistant

## 2023-11-01 VITALS — BP 104/72 | HR 67 | Ht 74.0 in | Wt 227.5 lb

## 2023-11-01 DIAGNOSIS — I480 Paroxysmal atrial fibrillation: Secondary | ICD-10-CM

## 2023-11-01 DIAGNOSIS — Z1589 Genetic susceptibility to other disease: Secondary | ICD-10-CM | POA: Diagnosis not present

## 2023-11-01 NOTE — Patient Instructions (Addendum)
 Medication Instructions:   STOP  TAKING : ELIQUIS   LAST DOSE ON 6-13- 25  STOP TAKING : COLCHICINE    *If you need a refill on your cardiac medications before your next appointment, please call your pharmacy*    Lab Work: NONE ORDERED  TODAY   If you have labs (blood work) drawn today and your tests are completely normal, you will receive your results only by: MyChart Message (if you have MyChart) OR A paper copy in the mail If you have any lab test that is abnormal or we need to change your treatment, we will call you to review the results.   Testing/Procedures: NONE ORDERED  TODAY    Follow-Up:  At [facility], you and your health needs are our priority.  As part of our continuing mission to provide you with exceptional heart care, our providers are all part of one team.  This team includes your primary Cardiologist (physician) and Advanced Practice Providers or APPs (Physician Assistants and Nurse Practitioners) who all work together to provide you with the care you need, when you need it.   Your next appointment:  YO HAVE BEEN REFERRED TO GENETICS  PROVIDER   6 month(s)   Provider:    You may see Will Cortland Ding, MD or one of the following Advanced Practice Providers on your designated Care Team:   Mertha Abrahams, [place]     We recommend signing up for the patient portal called "MyChart".  Sign up information is provided on this After Visit Summary.  MyChart is used to connect with patients for Virtual Visits (Telemedicine).  Patients are able to view lab/test results, encounter notes, upcoming appointments, etc.  Non-urgent messages can be sent to your provider as well.   To learn more about what you can do with MyChart, go to [email or web address].   Other Instructions

## 2023-11-03 ENCOUNTER — Other Ambulatory Visit: Payer: Self-pay | Admitting: *Deleted

## 2023-11-07 ENCOUNTER — Encounter: Payer: Self-pay | Admitting: Genetic Counselor

## 2023-11-07 NOTE — Telephone Encounter (Signed)
 Report as requested

## 2023-11-14 ENCOUNTER — Ambulatory Visit: Attending: Genetic Counselor | Admitting: Genetic Counselor

## 2023-11-21 NOTE — Progress Notes (Cosign Needed)
 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  Personal Medical Information [name] (IV.5 on pedigree) is a 38 y.o. Caucasian gentleman who works in the [employer]. He has a past medical history of AFib that he reports was diagnosed at age 65 and recently underwent an ablation. His cardiac imaging studies by [facility] (08/14/2023) detected normal biventricular function without evidence of hemodynamically significant valvular heart disease did not detect any structural or functional abnormalities and had a normal cardiac CT (07/14/23) study.  Family history The proband in the family is his mother's paternal aunt, age 62 (indicated by arrow, II.1) who had some arrhythmia issues. She underwent genetic testing through [facility]. She was found to have a truncating variant in FLNC gene, namely c.6190dup, p.Val2064GlyfsTer16; +/-.  Her daughter, [name], age 24 (III.1) was also found to harbor  this variant but has a structurally and functionally normal heart by MRI (2004). [name] is unaware if she has had recent imaging studies.   [name]'s two daughters, ages 49 and 96 (IV.1, IV.2) are also genotype positive and phenotype negative and her son who is in his 26s (IV.3) tested negative for this familial FLNC variant.   Relation to Patient Pedigree # Current age Heart condition/age of onset Notes  Children None            Identical twin brothers IV.4 48 [name] Has 2 sons, ages 25 and 3        Father III.2 65 TAA @ 64 Thoracic aneurysm being monitored  Mother III.3 Deceased None Died @ 60-breast cancer  Maternal aunt III.4 57 None   Maternal grandfather II.3 97s None Renal disease  Maternal grandmother II.4 41 None Afib   Genetic Consult notes  Genetic testing performed (by lab  unknown, report date ? 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  I explained to [name] that this particular variant has not yet been reported in other patients with NICM and hence is a novel variant. The genetic defect in [name] gene is predicted to create a truncated filamin C protein. This defective protein is cleared by the cellular surveillance system leading to haploinsufficiency where cells do not have the full complement of filamin C protein to function normally.  The main role of filamin C is to maintain the structural integrity and signaling of the cardiac contractile apparatus (sarcomere). Truncating mutations in FLNC gene leads to protein aggregation thereby causing disorganized myofibrils and muscle weakness in several animal models. FLNC variants that cause premature protein truncation are highly prevalent in DCM as this gene is intolerant of loss of function mutations.  In DCM patients, truncating FLNC mutations are associated with ventricular arrhythmias, myocardial fibrosis and high risk of sudden death. The mean age of onset for DCM in [facility] patients is 39.7+/-14.5 years.  However, considering that we do not have access to his mother's paternal aunt's medical history save for the fact that she has arrhythmias, it is important that her lineage undergoes cardiac screening in the genotype positive relatives.  He expressed understanding of this.  However, as his maternal grandfather is now in his 28s without cardiac disease, it is highly unlikely that he harbors this familial FLNC variant. The risk that [name] carries the familial FLNC variant is 12.5%. We discussed the protections afforded by  the Genetic Information Non-Discrimination Act (GINA). I explained to the patient that GINA protects them from losing their employment or health insurance based on their genotype. However, these protections do not cover life insurance and disability if he is found to  harbor  the familial pathogenic variant. It is important that he obtains appropriate life insurance. Patient verbalized understanding of this.  Plan [name] defers genetic testing for the familial FLNC variant as his maternal relatives do not have cardiac issues. The extended family members that harbor  this variant are related to his maternal grandfather who is now in his 43s and in good cardiac health. He likely does not harbor  this variant considering the he would be symptomatic as penetrance for FLNC mutations is greater than 97% after the age of 4.    Danford Marsh, Ph.D, [facility] Clinical Molecular Geneticist

## 2023-11-21 NOTE — Progress Notes (Deleted)
 Post Test Genetic Consult  Referral Reason  [name] was referred for a post-test genetic consult of her genetic test of the Cardiomyopathy Comprehensive panel that detected variants of unknown significance in ALPK3 and SCN5A genes.  Personal Medical Information   Xavier (III.4 on pedigree) is a pleasant 38 y.o. African American woman who was recently found to have heart failure when she was admitted in May 2024 with EF<20%. She reports feeling much better today compared to last year.  Traditional Risk Factors Xavier denies having myocarditis, ischemic heart disease, infiltrative myocardial disease (amyloidosis, sarcoidosis, hemochromatosis), infection with HIV virus, connective tissue disease (such as systemic lupus erythematosus etc.), doxorubicin therapy and heart valve disease. Does report having HTN.  She denies drug abuse but tells me of chronic alcohol abuse starting at age 16, when her mother passed away, until age 70 when she quit in 2013.    Family History  Relation to Proband Pedigree # Current age CVD/Age of onset Notes  Sons, 2 IV.7, IV.8 31, 24 None   Daughter IV.9 35 None   Grandkids V.1-V.6 20-9 None   Sisters, 3 III.1-III.3 65, 63, 60 None III.2- w/ asthma  Nephews, nieces IV.1-IV.6 40s-23 None         Father II.1 Deceased None Died @ 50- cancer Not in touch w/ paternal relatives  Mother II.2 Deceased M.I- 3x Died suddenly while playing bingo @ 67. Death certificate mentions "something "ischemic".    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 This note also includes the re-interpretation of his genetic test result.  The variants were re-interpreted to verify the test report interpretation and to compile the information in light of the patient's clinical presentation and family history  ALPK3 gene c.1621C>T, p.Arg541Cys; +/- The ALPK3 gene encodes,  alpha protein kinase 3, that is considered an essential cardiac pseudo kinase (does not have catalytic activity) localized in the nuclear envelop and sarcomere M- band and aids myosin-mediated force buffering and sarcomeric proteostasis.  Homozygous loss of function or deleterious missense mutations are associated with cardiomyopathy some detected as early as during late gestation with biventricular dilatation and reduced contractility observed in early infancy. Infants with such biallelic ALPK3 gene variants have variable phenotypes with some presenting with DCM that progresses to HCM over time. They have rapidly worsening disease with hemodynamic compensation eventually resulting in death in childhood.  Heterozygous loss of function ALPK3 gene variants that includes frameshift, splice site and nonsense mutations, have been linked to approx. 1.5% to 2.5% of unexplained ventricular hypertrophy in adults (mean age 83, +/- 15.9 years). These monoallelic variants display age-dependent penetrance (95% to 80%, respectively in males and females by age 31) and variable expression with parents of infants with autosomal recessive ALPK3 mutations not exhibiting clinical signs of cardiac disease. Most patients present with either apical or concentric patterns of hypertrophy.  The ALPK3 c.1621C>T, p.Arg541Cys variant is a heterozygous missense variant that has not yet been reported in patients with cardiomyopathy. In light of the available knowledgebase for heterozygous ALPK3 gene variants, missense variants are not linked to cardiomyopathy.  Thus, in the absence of functional data or family members with NICM to demonstrate segregation with disease, the ALPK3 c.1621C>T, p.Arg541Cys variant is a variant of unknown clinical significance.  It is likely that this is a rare benign variant.  SCN5A gene c.1490C>G, p.Ser497Cys; +/- The SCN5A gene encodes the alpha subunit of the cardiac sodium channel, Nav1.5  that is  responsible for the initiation and propagation of action potentials and subsequent cardiac excitability and conduction of electrical stimuli in the heart. Gain of function mutations in SCN5A gene causes long QT syndrome as there is an increased sodium ion influx into the cardiac cell, while loss of function mutations in this gene lead to Brugada syndrome. In addition, dilated cardiomyopathy has been associated with both loss of function and gain of function SCN5A gene mutations.  The SNC5A c.1490C>G, p.Ser497Cys variant has not been reported in patients with cardiomyopathy or channelopathies. It is a very rare variant, with an allele frequency of 6.5x10-6 in the general population. However, computational algorithms do not rank this variant highly as having a deleterious effect on protein function. This in the absence of additional functional data and lack of patients with this variant, the SCN5A c.1490C>G, p.Ser497Cys variant is a variant of unknown clinical significance.  It is likely that this is a rare benign variant.  Impression and Plan Therefore, as both ALPK3 and SCN5A variants are interpreted as variants of unknown clinical significance and considering her personal history of long-standing alcohol abuse, it is highly likely that her condition stems from the latter and the reported gene variants are not causal to her condition. As such, further testing of her family members for this variant is unwarranted.    Danford Pac, Ph.D, [facility] Clinical Molecular Geneticist
